# Patient Record
Sex: Female | Born: 1985 | Race: White | Hispanic: No | Marital: Single | Smoking: Never smoker
Health system: Southern US, Community
[De-identification: ages and names within clinical notes are randomized; demographics above are authoritative.]

## PROBLEM LIST (undated history)

## (undated) DIAGNOSIS — L509 Urticaria, unspecified: Secondary | ICD-10-CM

## (undated) DIAGNOSIS — F419 Anxiety disorder, unspecified: Secondary | ICD-10-CM

## (undated) DIAGNOSIS — E079 Disorder of thyroid, unspecified: Secondary | ICD-10-CM

## (undated) DIAGNOSIS — L309 Dermatitis, unspecified: Secondary | ICD-10-CM

## (undated) DIAGNOSIS — K219 Gastro-esophageal reflux disease without esophagitis: Secondary | ICD-10-CM

## (undated) DIAGNOSIS — J45909 Unspecified asthma, uncomplicated: Secondary | ICD-10-CM

## (undated) HISTORY — PX: REPLACEMENT OF URETER: SUR1216

## (undated) HISTORY — DX: Unspecified asthma, uncomplicated: J45.909

## (undated) HISTORY — DX: Dermatitis, unspecified: L30.9

## (undated) HISTORY — DX: Urticaria, unspecified: L50.9

---
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
## 2010-09-12 ENCOUNTER — Ambulatory Visit (HOSPITAL_COMMUNITY): Admission: RE | Admit: 2010-09-12 | Discharge: 2010-09-12 | Payer: Self-pay | Admitting: Physician Assistant

## 2010-09-16 ENCOUNTER — Ambulatory Visit (HOSPITAL_COMMUNITY)
Admission: RE | Admit: 2010-09-16 | Discharge: 2010-09-16 | Payer: Self-pay | Source: Home / Self Care | Admitting: Family Medicine

## 2011-01-10 ENCOUNTER — Inpatient Hospital Stay (INDEPENDENT_AMBULATORY_CARE_PROVIDER_SITE_OTHER)
Admission: RE | Admit: 2011-01-10 | Discharge: 2011-01-10 | Disposition: A | Discharge status: Home / Self Care | Payer: 59 | Source: Ambulatory Visit | Attending: Emergency Medicine | Admitting: Emergency Medicine

## 2011-01-10 DIAGNOSIS — R070 Pain in throat: Secondary | ICD-10-CM

## 2011-01-10 DIAGNOSIS — R05 Cough: Secondary | ICD-10-CM

## 2011-01-10 DIAGNOSIS — R0982 Postnasal drip: Secondary | ICD-10-CM

## 2011-01-10 LAB — POCT INFECTIOUS MONO SCREEN: Mono Screen: NEGATIVE

## 2011-01-10 LAB — POCT RAPID STREP A (OFFICE): Streptococcus, Group A Screen (Direct): NEGATIVE

## 2011-03-14 ENCOUNTER — Ambulatory Visit: Payer: 59

## 2011-03-14 ENCOUNTER — Inpatient Hospital Stay (INDEPENDENT_AMBULATORY_CARE_PROVIDER_SITE_OTHER)
Admission: RE | Admit: 2011-03-14 | Discharge: 2011-03-14 | Disposition: A | Discharge status: Home / Self Care | Payer: 59 | Source: Ambulatory Visit | Attending: Emergency Medicine | Admitting: Emergency Medicine

## 2011-03-14 DIAGNOSIS — H1045 Other chronic allergic conjunctivitis: Secondary | ICD-10-CM

## 2011-03-30 ENCOUNTER — Ambulatory Visit: Payer: 59 | Attending: Neurosurgery

## 2011-03-30 DIAGNOSIS — M25519 Pain in unspecified shoulder: Secondary | ICD-10-CM | POA: Insufficient documentation

## 2011-03-30 DIAGNOSIS — IMO0001 Reserved for inherently not codable concepts without codable children: Secondary | ICD-10-CM | POA: Insufficient documentation

## 2011-03-30 DIAGNOSIS — M542 Cervicalgia: Secondary | ICD-10-CM | POA: Insufficient documentation

## 2011-03-30 DIAGNOSIS — R293 Abnormal posture: Secondary | ICD-10-CM | POA: Insufficient documentation

## 2011-04-05 ENCOUNTER — Ambulatory Visit: Payer: 59

## 2011-04-06 ENCOUNTER — Ambulatory Visit: Payer: 59 | Admitting: Physical Therapy

## 2011-04-12 ENCOUNTER — Ambulatory Visit: Payer: 59

## 2011-04-14 ENCOUNTER — Ambulatory Visit: Payer: 59

## 2011-04-18 ENCOUNTER — Ambulatory Visit: Payer: 59

## 2011-04-20 ENCOUNTER — Ambulatory Visit: Payer: 59

## 2011-04-24 ENCOUNTER — Ambulatory Visit: Payer: 59 | Attending: Neurosurgery | Admitting: Physical Therapy

## 2011-04-24 DIAGNOSIS — IMO0001 Reserved for inherently not codable concepts without codable children: Secondary | ICD-10-CM | POA: Insufficient documentation

## 2011-04-24 DIAGNOSIS — M542 Cervicalgia: Secondary | ICD-10-CM | POA: Insufficient documentation

## 2011-04-24 DIAGNOSIS — M25519 Pain in unspecified shoulder: Secondary | ICD-10-CM | POA: Insufficient documentation

## 2011-04-24 DIAGNOSIS — R293 Abnormal posture: Secondary | ICD-10-CM | POA: Insufficient documentation

## 2011-04-26 ENCOUNTER — Ambulatory Visit: Payer: 59 | Admitting: Physical Therapy

## 2011-05-03 ENCOUNTER — Ambulatory Visit: Payer: 59

## 2011-05-05 ENCOUNTER — Ambulatory Visit: Payer: 59

## 2011-05-08 ENCOUNTER — Ambulatory Visit: Payer: 59

## 2011-05-09 ENCOUNTER — Ambulatory Visit: Payer: 59 | Admitting: Physical Therapy

## 2011-05-17 ENCOUNTER — Encounter: Payer: 59 | Admitting: Physical Therapy

## 2011-06-11 ENCOUNTER — Inpatient Hospital Stay (INDEPENDENT_AMBULATORY_CARE_PROVIDER_SITE_OTHER)
Admission: RE | Admit: 2011-06-11 | Discharge: 2011-06-11 | Disposition: A | Discharge status: Home / Self Care | Payer: 59 | Source: Ambulatory Visit | Attending: Family Medicine | Admitting: Family Medicine

## 2011-06-11 DIAGNOSIS — J020 Streptococcal pharyngitis: Secondary | ICD-10-CM

## 2011-06-11 LAB — POCT RAPID STREP A: Streptococcus, Group A Screen (Direct): NEGATIVE

## 2011-06-24 ENCOUNTER — Inpatient Hospital Stay (INDEPENDENT_AMBULATORY_CARE_PROVIDER_SITE_OTHER)
Admission: RE | Admit: 2011-06-24 | Discharge: 2011-06-24 | Disposition: A | Discharge status: Home / Self Care | Payer: 59 | Source: Ambulatory Visit | Attending: Family Medicine | Admitting: Family Medicine

## 2011-06-24 DIAGNOSIS — J039 Acute tonsillitis, unspecified: Secondary | ICD-10-CM

## 2011-06-24 DIAGNOSIS — J029 Acute pharyngitis, unspecified: Secondary | ICD-10-CM

## 2011-06-24 LAB — DIFFERENTIAL
Basophils Absolute: 0 10*3/uL (ref 0.0–0.1)
Basophils Relative: 0 % (ref 0–1)
Eosinophils Absolute: 0.1 10*3/uL (ref 0.0–0.7)
Eosinophils Relative: 0 % (ref 0–5)
Monocytes Absolute: 1.7 10*3/uL — ABNORMAL HIGH (ref 0.1–1.0)
Neutro Abs: 22 10*3/uL — ABNORMAL HIGH (ref 1.7–7.7)

## 2011-06-24 LAB — MONONUCLEOSIS SCREEN: Mono Screen: NEGATIVE

## 2011-06-24 LAB — CBC
MCHC: 34.3 g/dL (ref 30.0–36.0)
RDW: 13.2 % (ref 11.5–15.5)

## 2011-06-24 LAB — POCT RAPID STREP A: Streptococcus, Group A Screen (Direct): NEGATIVE

## 2011-06-25 ENCOUNTER — Emergency Department (HOSPITAL_COMMUNITY)
Admission: EM | Admit: 2011-06-25 | Discharge: 2011-06-25 | Disposition: A | Discharge status: Home / Self Care | Payer: 59 | Attending: Emergency Medicine | Admitting: Emergency Medicine

## 2011-06-25 ENCOUNTER — Inpatient Hospital Stay (INDEPENDENT_AMBULATORY_CARE_PROVIDER_SITE_OTHER)
Admission: RE | Admit: 2011-06-25 | Discharge: 2011-06-25 | Disposition: A | Discharge status: Home / Self Care | Payer: 59 | Source: Ambulatory Visit | Attending: Family Medicine | Admitting: Family Medicine

## 2011-06-25 DIAGNOSIS — J45909 Unspecified asthma, uncomplicated: Secondary | ICD-10-CM | POA: Insufficient documentation

## 2011-06-25 DIAGNOSIS — J029 Acute pharyngitis, unspecified: Secondary | ICD-10-CM

## 2011-06-25 DIAGNOSIS — R51 Headache: Secondary | ICD-10-CM | POA: Insufficient documentation

## 2011-06-25 DIAGNOSIS — E039 Hypothyroidism, unspecified: Secondary | ICD-10-CM | POA: Insufficient documentation

## 2011-06-25 DIAGNOSIS — Z79899 Other long term (current) drug therapy: Secondary | ICD-10-CM | POA: Insufficient documentation

## 2011-06-25 DIAGNOSIS — K219 Gastro-esophageal reflux disease without esophagitis: Secondary | ICD-10-CM | POA: Insufficient documentation

## 2011-06-25 LAB — DIFFERENTIAL
Basophils Relative: 0 % (ref 0–1)
Eosinophils Absolute: 0 10*3/uL (ref 0.0–0.7)
Eosinophils Relative: 0 % (ref 0–5)
Neutrophils Relative %: 90 % — ABNORMAL HIGH (ref 43–77)

## 2011-06-25 LAB — STREP A DNA PROBE: Group A Strep Probe: NEGATIVE

## 2011-06-25 LAB — CBC
MCH: 30.4 pg (ref 26.0–34.0)
Platelets: 363 10*3/uL (ref 150–400)
RBC: 3.81 MIL/uL — ABNORMAL LOW (ref 3.87–5.11)
RDW: 13.4 % (ref 11.5–15.5)
WBC: 23.6 10*3/uL — ABNORMAL HIGH (ref 4.0–10.5)

## 2011-11-28 ENCOUNTER — Other Ambulatory Visit: Payer: Self-pay | Admitting: Otolaryngology

## 2011-11-28 DIAGNOSIS — R599 Enlarged lymph nodes, unspecified: Secondary | ICD-10-CM

## 2011-11-28 DIAGNOSIS — J312 Chronic pharyngitis: Secondary | ICD-10-CM

## 2011-12-06 ENCOUNTER — Ambulatory Visit
Admission: RE | Admit: 2011-12-06 | Discharge: 2011-12-06 | Disposition: A | Discharge status: Home / Self Care | Payer: 59 | Source: Ambulatory Visit | Attending: Otolaryngology | Admitting: Otolaryngology

## 2011-12-06 DIAGNOSIS — R599 Enlarged lymph nodes, unspecified: Secondary | ICD-10-CM

## 2011-12-06 DIAGNOSIS — J312 Chronic pharyngitis: Secondary | ICD-10-CM

## 2012-12-19 ENCOUNTER — Other Ambulatory Visit (HOSPITAL_COMMUNITY): Payer: Self-pay | Admitting: Family Medicine

## 2012-12-19 DIAGNOSIS — R1011 Right upper quadrant pain: Secondary | ICD-10-CM

## 2012-12-20 ENCOUNTER — Ambulatory Visit (HOSPITAL_COMMUNITY)
Admission: RE | Admit: 2012-12-20 | Discharge: 2012-12-20 | Disposition: A | Discharge status: Home / Self Care | Payer: 59 | Source: Ambulatory Visit | Attending: Family Medicine | Admitting: Family Medicine

## 2012-12-20 DIAGNOSIS — R110 Nausea: Secondary | ICD-10-CM | POA: Insufficient documentation

## 2012-12-20 DIAGNOSIS — R1011 Right upper quadrant pain: Secondary | ICD-10-CM | POA: Insufficient documentation

## 2013-01-15 ENCOUNTER — Other Ambulatory Visit (HOSPITAL_COMMUNITY): Payer: Self-pay | Admitting: Physician Assistant

## 2013-01-15 DIAGNOSIS — R109 Unspecified abdominal pain: Secondary | ICD-10-CM

## 2013-01-29 ENCOUNTER — Encounter (HOSPITAL_COMMUNITY)
Admission: RE | Admit: 2013-01-29 | Discharge: 2013-01-29 | Disposition: A | Discharge status: Home / Self Care | Payer: 59 | Source: Ambulatory Visit | Attending: Physician Assistant | Admitting: Physician Assistant

## 2013-01-29 DIAGNOSIS — R109 Unspecified abdominal pain: Secondary | ICD-10-CM | POA: Insufficient documentation

## 2013-01-29 MED ORDER — TECHNETIUM TC 99M MEBROFENIN IV KIT
5.0000 | PACK | Freq: Once | INTRAVENOUS | Status: AC | PRN
  Administered 2013-01-29: 5 via INTRAVENOUS

## 2013-01-29 MED ORDER — SINCALIDE 5 MCG IJ SOLR
0.0200 ug/kg | Freq: Once | INTRAMUSCULAR | Status: DC
  Administered 2013-01-29: 1.34 ug via INTRAVENOUS

## 2017-03-08 ENCOUNTER — Ambulatory Visit (INDEPENDENT_AMBULATORY_CARE_PROVIDER_SITE_OTHER): Payer: Commercial Managed Care - PPO | Admitting: Otolaryngology

## 2017-03-08 DIAGNOSIS — J342 Deviated nasal septum: Secondary | ICD-10-CM | POA: Diagnosis not present

## 2017-03-08 DIAGNOSIS — J343 Hypertrophy of nasal turbinates: Secondary | ICD-10-CM | POA: Diagnosis not present

## 2017-03-08 DIAGNOSIS — J310 Chronic rhinitis: Secondary | ICD-10-CM

## 2017-04-19 ENCOUNTER — Ambulatory Visit (INDEPENDENT_AMBULATORY_CARE_PROVIDER_SITE_OTHER): Payer: Commercial Managed Care - PPO | Admitting: Otolaryngology

## 2017-04-19 DIAGNOSIS — J310 Chronic rhinitis: Secondary | ICD-10-CM

## 2017-04-19 DIAGNOSIS — R51 Headache: Secondary | ICD-10-CM | POA: Diagnosis not present

## 2017-04-24 ENCOUNTER — Other Ambulatory Visit (INDEPENDENT_AMBULATORY_CARE_PROVIDER_SITE_OTHER): Payer: Self-pay | Admitting: Otolaryngology

## 2017-04-24 DIAGNOSIS — J320 Chronic maxillary sinusitis: Secondary | ICD-10-CM

## 2017-04-30 ENCOUNTER — Ambulatory Visit (HOSPITAL_COMMUNITY)
Admission: RE | Admit: 2017-04-30 | Discharge: 2017-04-30 | Disposition: A | Discharge status: Home / Self Care | Payer: Commercial Managed Care - PPO | Source: Ambulatory Visit | Attending: Otolaryngology | Admitting: Otolaryngology

## 2017-04-30 DIAGNOSIS — J320 Chronic maxillary sinusitis: Secondary | ICD-10-CM

## 2017-06-25 ENCOUNTER — Ambulatory Visit (INDEPENDENT_AMBULATORY_CARE_PROVIDER_SITE_OTHER): Payer: No Typology Code available for payment source | Admitting: Otolaryngology

## 2017-06-25 DIAGNOSIS — J342 Deviated nasal septum: Secondary | ICD-10-CM | POA: Diagnosis not present

## 2017-06-25 DIAGNOSIS — J343 Hypertrophy of nasal turbinates: Secondary | ICD-10-CM | POA: Diagnosis not present

## 2017-06-25 DIAGNOSIS — J310 Chronic rhinitis: Secondary | ICD-10-CM | POA: Diagnosis not present

## 2017-11-13 ENCOUNTER — Encounter (HOSPITAL_BASED_OUTPATIENT_CLINIC_OR_DEPARTMENT_OTHER): Payer: Self-pay | Admitting: Emergency Medicine

## 2017-11-13 ENCOUNTER — Other Ambulatory Visit: Payer: Self-pay

## 2017-11-13 ENCOUNTER — Emergency Department (HOSPITAL_BASED_OUTPATIENT_CLINIC_OR_DEPARTMENT_OTHER): Payer: No Typology Code available for payment source

## 2017-11-13 ENCOUNTER — Emergency Department (HOSPITAL_BASED_OUTPATIENT_CLINIC_OR_DEPARTMENT_OTHER)
Admission: EM | Admit: 2017-11-13 | Discharge: 2017-11-13 | Disposition: A | Discharge status: Home / Self Care | Payer: No Typology Code available for payment source | Attending: Emergency Medicine | Admitting: Emergency Medicine

## 2017-11-13 DIAGNOSIS — Y939 Activity, unspecified: Secondary | ICD-10-CM | POA: Diagnosis not present

## 2017-11-13 DIAGNOSIS — Z79899 Other long term (current) drug therapy: Secondary | ICD-10-CM | POA: Diagnosis not present

## 2017-11-13 DIAGNOSIS — Y929 Unspecified place or not applicable: Secondary | ICD-10-CM | POA: Diagnosis not present

## 2017-11-13 DIAGNOSIS — S022XXA Fracture of nasal bones, initial encounter for closed fracture: Secondary | ICD-10-CM | POA: Diagnosis not present

## 2017-11-13 DIAGNOSIS — W228XXA Striking against or struck by other objects, initial encounter: Secondary | ICD-10-CM | POA: Diagnosis not present

## 2017-11-13 DIAGNOSIS — Y998 Other external cause status: Secondary | ICD-10-CM | POA: Diagnosis not present

## 2017-11-13 DIAGNOSIS — S0992XA Unspecified injury of nose, initial encounter: Secondary | ICD-10-CM | POA: Diagnosis present

## 2017-11-13 HISTORY — DX: Anxiety disorder, unspecified: F41.9

## 2017-11-13 HISTORY — DX: Disorder of thyroid, unspecified: E07.9

## 2017-11-13 HISTORY — DX: Gastro-esophageal reflux disease without esophagitis: K21.9

## 2017-11-13 MED ORDER — OXYMETAZOLINE HCL 0.05 % NA SOLN: 1.0000 | Freq: Two times a day (BID) | NASAL | 0 refills | Status: AC

## 2017-11-13 NOTE — ED Provider Notes (Signed)
[facility] EMERGENCY DEPARTMENT Provider Note   CSN: [id number] Arrival date & time: 11/13/17  1249     History   Chief Complaint Chief Complaint  Patient presents with  . Headache    HPI Jennifer [name] is a 31 y.o. female.  HPI   31 year old female presents with concern for nasal pain, frontal headache, after hitting her nose with her truck door last night.  Patient reports that she is loading things into the back of the car, and the truck fell down, hitting her directly in the nose.  It did not hit her anywhere else in the head, no loss of consciousness.  No nausea, vomiting.  Reports that she had noted some blood in her throat last night, but has not had other bleeding.  Reports chronic nasal drainage and sinus congestion.  Reports she has significant amount of pressure like pain around her nose.   Past Medical History:  Diagnosis Date  . Anxiety   . GERD (gastroesophageal reflux disease)   . Thyroid disease     There are no active problems to display for this patient.   History reviewed. No pertinent surgical history.  OB History    No data available       Home Medications    Prior to Admission medications   Medication Sig Start Date End Date Taking? Authorizing Provider  Azelastine-Fluticasone (DYMISTA NA) Place into the nose.   Yes [provider]  DiazePAM (VALIUM PO) Take by mouth.   Yes [provider]  levothyroxine (SYNTHROID, LEVOTHROID) 50 MCG tablet Take 50 mcg by mouth daily before breakfast.   Yes [provider]  omeprazole (PRILOSEC) 20 MG capsule Take 20 mg by mouth daily.   Yes [provider]  sertraline (ZOLOFT) 25 MG tablet Take 25 mg by mouth daily.   Yes [provider]  oxymetazoline (AFRIN NASAL SPRAY) 0.05 % nasal spray Place 1 spray into both nostrils 2 (two) times daily for 3 days. 11/13/17 11/16/17  Alvira MondaySchlossman, Kostas Marrow, MD    Family History History reviewed. No pertinent family  history.  Social History Social History   Tobacco Use  . Smoking status: Never Smoker  . Smokeless tobacco: Never Used  Substance Use Topics  . Alcohol use: No    Frequency: Never  . Drug use: No     Allergies   Amoxicillin; Ceftin [cefuroxime axetil]; and Penicillins   Review of Systems Review of Systems  Constitutional: Negative for fever.  HENT: Negative for sore throat.   Eyes: Negative for visual disturbance.  Respiratory: Negative for cough and shortness of breath.   Cardiovascular: Negative for chest pain.  Gastrointestinal: Negative for abdominal pain, nausea and vomiting.  Genitourinary: Negative for difficulty urinating.  Musculoskeletal: Positive for arthralgias. Negative for back pain and neck pain.  Skin: Negative for rash.  Neurological: Negative for syncope and headaches.     Physical Exam Updated Vital Signs BP (!) 160/108 (BP Location: Left Arm) Comment: pt vomited in waiting area Bathroom before triage  Pulse 96   Temp 97.9 F (36.6 C) (Oral)   Resp 20   Ht 5\' 5"  (1.651 m)   Wt 113.4 kg (250 lb)   LMP 10/29/2017   SpO2 100%   BMI 41.60 kg/m   Physical Exam  Constitutional: She is oriented to person, place, and time. She appears well-developed and well-nourished. No distress.  HENT:  Head: Normocephalic and atraumatic.  Mouth/Throat: Oropharynx is clear and moist.  pipoint abrasion left  side of nose Mild septal deviation No nasal septal hematomas Mild nasal bridge swelling   Eyes: Conjunctivae and EOM are normal. Pupils are equal, round, and reactive to light.  Neck: Normal range of motion.  Cardiovascular: Normal rate, regular rhythm and intact distal pulses.  Pulmonary/Chest: Effort normal. No respiratory distress.  Neurological: She is alert and oriented to person, place, and time.  Skin: Skin is warm and dry. No rash noted. She is not diaphoretic. No erythema.  Nursing note and vitals reviewed.    ED Treatments / Results   Labs (all labs ordered are listed, but only abnormal results are displayed) Labs Reviewed - No data to display  EKG  EKG Interpretation None       Radiology Dg Nasal Bones  Result Date: 11/13/2017 CLINICAL DATA:  Blunt trauma to the nose. Left side pain greater than right. EXAM: NASAL BONES - 3+ VIEW COMPARISON:  Sinus CT 04/30/2017 FINDINGS: Concern for a minimally displaced fracture involving the left nasal bone. Right nasal bone appears intact. Visualized paranasal sinuses are aerated. IMPRESSION: Concern for a minimally displaced left nasal bone fracture. Electronically Signed   By: Richarda OverlieAdam  Henn M.D.   On: 11/13/2017 13:48    Procedures Procedures (including critical care time)  Medications Ordered in ED Medications - No data to display   Initial Impression / Assessment and Plan / ED Course  I have reviewed the triage vital signs and the nursing notes.  Pertinent labs & imaging results that were available during my care of the patient were reviewed by me and considered in my medical decision making (see chart for details).     31yo female presents with concern for nasal and facial pain after hitting her nose last night.  She had no head trauma, low suspicion for intracranial injury.  She also denies any trauma to the face.  Suspect this is an isolated nasal bone fracture.  She has no sign of nasal septal hematoma.  She sees Dr. Jodean Limaio, recommend follow-up as needed if she desires surgical repair. Recommend ice, ibuprofen, tylenol. Patient discharged in stable condition with understanding of reasons to return.   Final Clinical Impressions(s) / ED Diagnoses   Final diagnoses:  Closed fracture of nasal bone, initial encounter    ED Discharge Orders        Ordered    oxymetazoline (AFRIN NASAL SPRAY) 0.05 % nasal spray  2 times daily     11/13/17 1431       Alvira MondaySchlossman, Domino Holten, MD 11/13/17 1612

## 2017-11-13 NOTE — ED Triage Notes (Signed)
Patient states that she had a trunk lid hit her nasal bone last night,.Since she has had pain and swelling with intermittent nose bleed and headache

## 2017-11-15 ENCOUNTER — Ambulatory Visit (INDEPENDENT_AMBULATORY_CARE_PROVIDER_SITE_OTHER): Payer: No Typology Code available for payment source | Admitting: Otolaryngology

## 2017-11-15 DIAGNOSIS — J343 Hypertrophy of nasal turbinates: Secondary | ICD-10-CM | POA: Diagnosis not present

## 2017-11-15 DIAGNOSIS — J342 Deviated nasal septum: Secondary | ICD-10-CM

## 2017-11-15 DIAGNOSIS — S022XXA Fracture of nasal bones, initial encounter for closed fracture: Secondary | ICD-10-CM

## 2018-01-24 ENCOUNTER — Encounter (HOSPITAL_COMMUNITY): Payer: Self-pay | Admitting: Emergency Medicine

## 2018-01-24 ENCOUNTER — Ambulatory Visit (INDEPENDENT_AMBULATORY_CARE_PROVIDER_SITE_OTHER): Payer: No Typology Code available for payment source

## 2018-01-24 ENCOUNTER — Ambulatory Visit (HOSPITAL_COMMUNITY)
Admission: EM | Admit: 2018-01-24 | Discharge: 2018-01-24 | Disposition: A | Discharge status: Home / Self Care | Payer: No Typology Code available for payment source | Attending: Family Medicine | Admitting: Family Medicine

## 2018-01-24 DIAGNOSIS — J4521 Mild intermittent asthma with (acute) exacerbation: Secondary | ICD-10-CM

## 2018-01-24 DIAGNOSIS — R0789 Other chest pain: Secondary | ICD-10-CM

## 2018-01-24 DIAGNOSIS — R0602 Shortness of breath: Secondary | ICD-10-CM

## 2018-01-24 MED ORDER — FLUCONAZOLE 150 MG PO TABS: 150.0000 mg | ORAL_TABLET | Freq: Once | ORAL | 0 refills | Status: AC

## 2018-01-24 MED ORDER — PREDNISONE 10 MG (21) PO TBPK: ORAL_TABLET | Freq: Every day | ORAL | 0 refills | Status: DC

## 2018-01-24 NOTE — ED Provider Notes (Addendum)
[facility]ARE [facility]   [id number] 01/24/18 Arrival Time: 1956   SUBJECTIVE:  Jennifer [name] is a 32 y.o. female who presents to the urgent care with complaint of shortness of breath.  Pt states shes been taking doxy for 10 days, and prednisone for five days for sinus issues, states shes still been feeling bad still, with some shortness of breath. Pt requesting xray.   The prednisone did help, but she finished it 4 days ago and symptoms are worsening  Patient has asthma and is using her inhaler.  Her tongue is getting sore.  Patient works in [employer].   Past Medical History:  Diagnosis Date  . Anxiety   . GERD (gastroesophageal reflux disease)   . Thyroid disease    No family history on file. Social History   Socioeconomic History  . Marital status: Single    Spouse name: Not on file  . Number of children: Not on file  . Years of education: Not on file  . Highest education level: Not on file  Social Needs  . Financial resource strain: Not on file  . Food insecurity - worry: Not on file  . Food insecurity - inability: Not on file  . Transportation needs - medical: Not on file  . Transportation needs - non-medical: Not on file  Occupational History  . Not on file  Tobacco Use  . Smoking status: Never Smoker  . Smokeless tobacco: Never Used  Substance and Sexual Activity  . Alcohol use: No    Frequency: Never  . Drug use: No  . Sexual activity: Not on file  Other Topics Concern  . Not on file  Social History Narrative  . Not on file   Current Meds  Medication Sig  . [DISCONTINUED] doxycycline (VIBRAMYCIN) 100 MG capsule Take 100 mg by mouth 2 (two) times daily.   Allergies  Allergen Reactions  . Amoxicillin   . Ceftin [Cefuroxime Axetil]   . Penicillins       ROS: As per HPI, remainder of ROS negative.   OBJECTIVE:   Vitals:   01/24/18 2002  BP: (!) 154/92  Pulse: 93  Resp: 18  Temp: 98.1 F (36.7 C)  SpO2: 100%     General appearance: alert;  no distress Eyes: PERRL; EOMI; conjunctiva normal HENT: normocephalic; atraumatic; TMs normal, canal normal, external ears normal without trauma; nasal mucosa normal; oral mucosa coated tongue Neck: supple Lungs: clear to auscultation bilaterally Heart: regular rate and rhythm Back: no CVA tenderness Extremities: no cyanosis or edema; symmetrical with no gross deformities Skin: warm and dry Neurologic: normal gait; grossly normal Psychological: alert and cooperative; normal mood and affect      Labs:  Results for orders placed or performed during the hospital encounter of 06/25/11  Differential  Result Value Ref Range   Neutrophils Relative % 90 (H) 43 - 77 %   Neutro Abs 21.2 (H) 1.7 - 7.7 K/uL   Lymphocytes Relative 5 (L) 12 - 46 %   Lymphs Abs 1.3 0.7 - 4.0 K/uL   Monocytes Relative 5 3 - 12 %   Monocytes Absolute 1.1 (H) 0.1 - 1.0 K/uL   Eosinophils Relative 0 0 - 5 %   Eosinophils Absolute 0.0 0.0 - 0.7 K/uL   Basophils Relative 0 0 - 1 %   Basophils Absolute 0.0 0.0 - 0.1 K/uL  CBC  Result Value Ref Range   WBC 23.6 (H) 4.0 - 10.5 K/uL   RBC 3.81 (L) 3.87 -  5.11 MIL/uL   Hemoglobin 11.6 (L) 12.0 - 15.0 g/dL   HCT 16.134.5 (L) 09.636.0 - 04.546.0 %   MCV 90.6 78.0 - 100.0 fL   MCH 30.4 26.0 - 34.0 pg   MCHC 33.6 30.0 - 36.0 g/dL   RDW 40.913.4 81.111.5 - 91.415.5 %   Platelets 363 150 - 400 K/uL    Labs Reviewed - No data to display  Dg Chest 2 View  Result Date: 01/24/2018 CLINICAL DATA:  Per pt: has been on antibiotics for a week for a sinus infection, having chest pain and tightness, no fever, feeling hot, chest pain mid sternum, fatigue sleeping a lot. History of pneumonia age 32. Non smoker. EXAM: CHEST - 2 VIEW COMPARISON:  11/15/2011 FINDINGS: The heart size and mediastinal contours are within normal limits. Both lungs are clear. The visualized skeletal structures are unremarkable. IMPRESSION: No active cardiopulmonary disease. Electronically Signed   By: Norva PavlovElizabeth  Brown M.D.    On: 01/24/2018 20:18       ASSESSMENT & PLAN:  1. Mild intermittent asthma with exacerbation     Meds ordered this encounter  Medications  . fluconazole (DIFLUCAN) 150 MG tablet    Sig: Take 1 tablet (150 mg total) by mouth once for 1 dose. Repeat if needed    Dispense:  2 tablet    Refill:  0  . predniSONE (STERAPRED UNI-PAK 21 TAB) 10 MG (21) TBPK tablet    Sig: Take by mouth daily. Take 6 tabs by mouth daily  for 2 days, then 5 tabs for 2 days, then 4 tabs for 2 days, then 3 tabs for 2 days, 2 tabs for 2 days, then 1 tab by mouth daily for 2 days    Dispense:  42 tablet    Refill:  0    Reviewed expectations re: course of current medical issues. Questions answered. Outlined signs and symptoms indicating need for more acute intervention. Patient verbalized understanding. After Visit Summary given.    Procedures:      Elvina SidleLauenstein, Jontez Redfield, MD 01/24/18 2028    Elvina SidleLauenstein, Viveka Wilmeth, MD 01/24/18 2032

## 2018-01-24 NOTE — Discharge Instructions (Signed)
Discontinue the doxycycline

## 2018-01-24 NOTE — ED Triage Notes (Signed)
Pt states shes been taking doxy for 10 days, and prednisone for five days for sinus issues, states shes still been feeling bad still, with some shortness of breath. Pt requesting xray.

## 2018-06-28 ENCOUNTER — Ambulatory Visit (HOSPITAL_COMMUNITY)
Admission: EM | Admit: 2018-06-28 | Discharge: 2018-06-28 | Disposition: A | Discharge status: Home / Self Care | Payer: No Typology Code available for payment source | Attending: Family Medicine | Admitting: Family Medicine

## 2018-06-28 ENCOUNTER — Encounter (HOSPITAL_COMMUNITY): Payer: Self-pay | Admitting: Emergency Medicine

## 2018-06-28 DIAGNOSIS — L509 Urticaria, unspecified: Secondary | ICD-10-CM | POA: Diagnosis not present

## 2018-06-28 MED ORDER — ACYCLOVIR 400 MG PO TABS: 400.0000 mg | ORAL_TABLET | Freq: Two times a day (BID) | ORAL | 11 refills | Status: DC

## 2018-06-28 NOTE — ED Triage Notes (Signed)
Pt sts allergic reaction with hives x several weeks worse over last two days; pt sts went to PCP yesterday and given steroid injection; pt sts after eating lunch today had some flushed feeling and tightness in chest

## 2018-06-28 NOTE — Discharge Instructions (Addendum)
Several suggestions:  Zyrtec (Cetirizine) 10 mg twice daily  (H2 antihistamine) Zantac (Ranitidine) 150 mg twice dail (H1 antihistamine)  Acyclovir to treat for cold sores, a common trigger for hives, twice a day for 6 months.  You may need to see an allergist

## 2018-06-28 NOTE — ED Provider Notes (Signed)
[facility] CARE CENTER   [id number] 06/28/18 Arrival Time: 1651   SUBJECTIVE:  Jennifer [name] is a 32 y.o. female who presents to the urgent care with complaint of allergic reaction with hives x several weeks worse over last two days; pt sts went to PCP yesterday and given steroid injection; pt sts after eating lunch today had some flushed feeling and tightness in chest  Patient has had progressive total body itching x 3 months.  She has a h/o HSV I with multiple recurrences.      Past Medical History:  Diagnosis Date  . Anxiety   . GERD (gastroesophageal reflux disease)   . Thyroid disease    History reviewed. No pertinent family history. Social History   Socioeconomic History  . Marital status: Single    Spouse name: Not on file  . Number of children: Not on file  . Years of education: Not on file  . Highest education level: Not on file  Occupational History  . Not on file  Social Needs  . Financial resource strain: Not on file  . Food insecurity:    Worry: Not on file    Inability: Not on file  . Transportation needs:    Medical: Not on file    Non-medical: Not on file  Tobacco Use  . Smoking status: Never Smoker  . Smokeless tobacco: Never Used  Substance and Sexual Activity  . Alcohol use: No    Frequency: Never  . Drug use: No  . Sexual activity: Not on file  Lifestyle  . Physical activity:    Days per week: Not on file    Minutes per session: Not on file  . Stress: Not on file  Relationships  . Social connections:    Talks on phone: Not on file    Gets together: Not on file    Attends religious service: Not on file    Active member of club or organization: Not on file    Attends meetings of clubs or organizations: Not on file    Relationship status: Not on file  . Intimate partner violence:    Fear of current or ex partner: Not on file    Emotionally abused: Not on file    Physically abused: Not on file    Forced sexual activity: Not on file    Other Topics Concern  . Not on file  Social History Narrative  . Not on file   No outpatient medications have been marked as taking for the 06/28/18 encounter [facility] Encounter).   Allergies  Allergen Reactions  . Amoxicillin   . Ceftin [Cefuroxime Axetil]   . Penicillins       ROS: As per HPI, remainder of ROS negative.   OBJECTIVE:   Vitals:   06/28/18 1659  BP: 137/74  Pulse: 82  Resp: 18  Temp: 98.1 F (36.7 C)  TempSrc: Oral  SpO2: 100%     General appearance: alert; no distress Eyes: PERRL; EOMI; conjunctiva normal HENT: normocephalic; atraumatic;  external ears normal without trauma; nasal mucosa normal; oral mucosa normal Neck: supple Lungs: clear to auscultation bilaterally Heart: regular rate and rhythm Extremities: no cyanosis or edema; symmetrical with no gross deformities Skin: warm and dry Neurologic: normal gait; grossly normal Psychological: alert and cooperative; normal mood and affect      Labs:  Results for orders placed or performed during the hospital encounter of 06/25/11  Differential  Result Value Ref Range   Neutrophils Relative % 90 (H)  43 - 77 %   Neutro Abs 21.2 (H) 1.7 - 7.7 K/uL   Lymphocytes Relative 5 (L) 12 - 46 %   Lymphs Abs 1.3 0.7 - 4.0 K/uL   Monocytes Relative 5 3 - 12 %   Monocytes Absolute 1.1 (H) 0.1 - 1.0 K/uL   Eosinophils Relative 0 0 - 5 %   Eosinophils Absolute 0.0 0.0 - 0.7 K/uL   Basophils Relative 0 0 - 1 %   Basophils Absolute 0.0 0.0 - 0.1 K/uL  CBC  Result Value Ref Range   WBC 23.6 (H) 4.0 - 10.5 K/uL   RBC 3.81 (L) 3.87 - 5.11 MIL/uL   Hemoglobin 11.6 (L) 12.0 - 15.0 g/dL   HCT 16.134.5 (L) 09.636.0 - 04.546.0 %   MCV 90.6 78.0 - 100.0 fL   MCH 30.4 26.0 - 34.0 pg   MCHC 33.6 30.0 - 36.0 g/dL   RDW 40.913.4 81.111.5 - 91.415.5 %   Platelets 363 150 - 400 K/uL    Labs Reviewed - No data to display  No results found.     ASSESSMENT & PLAN:  1. Urticaria   Several suggestions:  Zyrtec (Cetirizine) 10  mg twice daily  (H2 antihistamine) Zantac (Ranitidine) 150 mg twice dail (H1 antihistamine)  Acyclovir to treat for cold sores, a common trigger for hives, twice a day for 6 months.  You may need to see an allergist  Meds ordered this encounter  Medications  . acyclovir (ZOVIRAX) 400 MG tablet    Sig: Take 1 tablet (400 mg total) by mouth 2 (two) times daily.    Dispense:  60 tablet    Refill:  11    Reviewed expectations re: course of current medical issues. Questions answered. Outlined signs and symptoms indicating need for more acute intervention. Patient verbalized understanding. After Visit Summary given.    Procedures:      Elvina SidleLauenstein, Aubry Tucholski, MD 06/28/18 1759

## 2018-08-07 ENCOUNTER — Ambulatory Visit: Payer: PRIVATE HEALTH INSURANCE | Admitting: Allergy & Immunology

## 2018-08-07 ENCOUNTER — Encounter: Payer: Self-pay | Admitting: Allergy & Immunology

## 2018-08-07 VITALS — BP 128/88 | HR 97 | Temp 98.5°F | Resp 14 | Ht 61.5 in | Wt 207.8 lb

## 2018-08-07 DIAGNOSIS — J4520 Mild intermittent asthma, uncomplicated: Secondary | ICD-10-CM

## 2018-08-07 DIAGNOSIS — J302 Other seasonal allergic rhinitis: Secondary | ICD-10-CM

## 2018-08-07 DIAGNOSIS — J3089 Other allergic rhinitis: Secondary | ICD-10-CM | POA: Diagnosis not present

## 2018-08-07 DIAGNOSIS — L500 Allergic urticaria: Secondary | ICD-10-CM

## 2018-08-07 LAB — PULMONARY FUNCTION TEST

## 2018-08-07 MED ORDER — AZELASTINE HCL 0.1 % NA SOLN: 2.0000 | Freq: Two times a day (BID) | NASAL | 5 refills | Status: DC

## 2018-08-07 MED ORDER — FLUTICASONE PROPIONATE 50 MCG/ACT NA SUSP: 2.0000 | Freq: Every day | NASAL | 5 refills | Status: DC | PRN

## 2018-08-07 NOTE — Progress Notes (Signed)
NEW PATIENT  Date of Service/Encounter:  08/07/18  Referring provider: Selinda Flavin, MD   Assessment:   Allergic urticaria  Perennial and seasonal allergic rhinitis (grasses, weeds, trees, molds, dust mites, cat, dog, horse, mouse)  Mild intermittent asthma, uncomplicated   Asthma Reportables:  Severity: intermittent  Risk: low Control: well controlled  Seasonal Influenza Vaccine: no but encouraged     Plan/Recommendations:   Allergic urticaria Continue ranitidine 150 mg twice a day Continue cetrizine 10 mg twice a day Stop acyclovir for now  Allergic rhinitis Allergen avoidance measures provided Flonase 2 sprays in each nostril once a day as needed for a stuffy nose Astelin nasal spray 2 sprays in each nostril twice a day as needed for a runny nose or sneezing Consider saline nasal rinses. Use this before medicated nasal sprays - Consider allergy shots as a means of long-term control. - Allergy shots "re-train" and "reset" the immune system to ignore environmental allergens and decrease the resulting immune response to those allergens (sneezing, itchy watery eyes, runny nose, nasal congestion, etc).    - Allergy shots improve symptoms in 75-85% of patients. Call the office if you want to move forward with allergy shots.   Mild intermittent asthma  Continue Proventil 2 puffs every 4 hours as needed for cough or wheeze. Use Proventil 5-15 minutes before exercise to prevent cough or wheeze  Follow up in 4 weeks or sooner if needed.    Subjective:   Jennifer [name] is a 32 y.o. female presenting today for evaluation of  Chief Complaint  Patient presents with  . Urticaria    Jennifer [name] has a history of the following: There are no active problems to display for this patient.   History obtained from: chart review and patient interview.  Kathrine Cords was referred by Selinda Flavin, MD.     Jennifer [name] is a 32 y.o. female presenting for hives that occurred  about 1 1/2 months ago for which she visited the emergency department on 06/28/2018 and was prescribed  Cetirizine 10 mg twice a day, ranitidine 150 mg twice a day, and acyclovir 400 mg twice a day. She reports that this combination of medications has decreased the frequency and severity of hives. Over the past 1 1/2 months, Simone has experienced recurrent episodes of hives. The hives have appeared at different times over her entire body.  The lesions are described as erythematous, raised, and pruritic.  Individual hives last less than 24 hours without leaving residual pigmentation or bruising. She denies concomitant angioedema, cardiopulmonary symptoms and GI symptoms. She has not experienced unexpected weight loss, recurrent fevers or drenching night sweats. No specific medication, food, skin care product, detergent, soap, or other environmental triggers have been identified. The symptoms do not seem to correlate with NSAIDs use or emotional stress. She did not have symptoms consistent with a respiratory tract infection at the time of symptom onset. [name] has tried to control symptoms with OTC antihistamines which have offered minimal temporary relief. She has been evaluated and treated in the emergency department for these symptoms. Skin biopsy has not been performed. Recently, she has been experiencing hives daily. Reporting no fever, myalgias, or arthralgias.   Asthma/Respiratory Symptom History: [name] reports having asthma as a child for which she thinks she used Advair which she last used at about age 68. She reports recently she has experienced occasional shortness of breath and has had a cough with clear mucus for the last few days. She  reports that she uses albuterol via inhaler about once every 2-3 months with relief of symptoms.   Allergic Rhinitis Symptom History: She reports nasal congestion, runny nose, and sneezing that are year round with a seasonal component. She takes Claritin D for relief of  these symptoms and uses Zaditor eye drops as needed.   Food Allergy Symptom History: She reports that she eats a varied diet that is frequently at a restaurant and is not aware of any foods that may be causing hives. She reports that, prior to a month and a half ago when she started taking ranitidine, she had frequent heartburn.   Otherwise, there is no history of other atopic diseases, including asthma, food allergies, environmental allergies, eczema or urticaria. There is no significant infectious history. Vaccinations are up to date.   Past Medical History: There are no active problems to display for this patient.   Medication List:  Allergies as of 08/07/2018      Reactions   Amoxicillin    Ceftin [cefuroxime Axetil]    Penicillins       Medication List        Accurate as of 08/07/18 11:59 PM. Always use your most recent med list.          acyclovir 400 MG tablet Commonly known as:  ZOVIRAX Take 1 tablet (400 mg total) by mouth 2 (two) times daily.   azelastine 0.1 % nasal spray Commonly known as:  ASTELIN Place 2 sprays into both nostrils 2 (two) times daily.   cetirizine 10 MG tablet Commonly known as:  ZYRTEC Take 10 mg by mouth 2 (two) times daily.   DYMISTA NA Place into the nose.   fluticasone 50 MCG/ACT nasal spray Commonly known as:  FLONASE Place 2 sprays into both nostrils daily as needed for allergies or rhinitis.   GOODY HEADACHE PO Take by mouth.   IBUPROFEN PO Take by mouth.   ketotifen 0.025 % ophthalmic solution Commonly known as:  ZADITOR Place 1 drop into both eyes daily as needed.   levothyroxine 88 MCG tablet Commonly known as:  SYNTHROID, LEVOTHROID 1 tablet from Monday to Saturday. Take only 2 tablet every Sunday.   LO LOESTRIN FE 1 MG-10 MCG / 10 MCG tablet Generic drug:  Norethindrone-Ethinyl Estradiol-Fe Biphas TK 1 T PO QD   prednisoLONE acetate 1 % ophthalmic suspension Commonly known as:  PRED FORTE INSTILL 1 DROP INTO  BOTH EYES QID AS DIRECTED   PROBIOTIC DAILY PO Take by mouth.   ranitidine 150 MG tablet Commonly known as:  ZANTAC Take 150 mg by mouth 2 (two) times daily.   VALIUM PO Take by mouth.   VENTOLIN HFA 108 (90 Base) MCG/ACT inhaler Generic drug:  albuterol INHALE 2 PUFFS QID PRN       Past Surgical History: Past Surgical History:  Procedure Laterality Date  . REPLACEMENT OF URETER     Ureters reimplanted in bladder  . WISDOM TOOTH EXTRACTION  2004     Family History: Family History  Problem Relation Age of Onset  . Asthma Mother   . Urticaria Mother   . Allergic rhinitis Mother   . Asthma Brother   . Eczema Brother   . Allergic rhinitis Brother      Social History: Dewayne Hatchnn lives in a 1050+ year old apartment with carpeting throughout. Heating is electric and cooling is central. There is a cat in the home who has access to her bedroom. There is no concern for water damage  or mildew in the home. There are no dust mite free covers in use on the mattress or pillows. There is no concern for exposure to fumes, chemicals, or dust in the home or workplace. 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    Review of Systems: a 14-point review of systems is pertinent for what is mentioned in HPI.  Otherwise, all other systems were negative. Constitutional: negative other than that listed in the HPI Eyes: negative other than that listed in the HPI Ears, nose, mouth, throat, and face: negative other than that listed in the HPI Respiratory: negative other than that listed in the HPI Cardiovascular: negative other than that listed in the HPI Gastrointestinal: negative other than that listed in the HPI Genitourinary: negative other than that listed in the HPI Integument: negative other than that listed in the HPI Hematologic: negative other than that listed in the HPI Musculoskeletal: negative other than that listed in the HPI Neurological: negative other than that listed in the  HPI Allergy/Immunologic: negative other than that listed in the HPI    Objective:   Blood pressure 128/88, pulse 97, temperature 98.5 F (36.9 C), temperature source Oral, resp. rate 14, height 5' 1.5" (1.562 m), weight 207 lb 12.8 oz (94.3 kg), SpO2 97 %. Body mass index is 38.63 kg/m.   Physical Exam:  General: Alert, interactive, in no acute distress. Eyes: No conjunctival injection present on the right, No conjunctival injection present on the left, no discharge on the left, no Horner-Trantas dots present and allergic shiners present bilaterally. PERRL bilaterally. EOMI without pain. No photophobia.  Ears: Right TM pearly gray with normal light reflex, Left TM pearly gray with normal light reflex, Left TM unable to be visualized due to cerumen impaction, Patent tympanostomy tube present on the right and Patent tympanostomy tube present on the left.  Nose/Throat: External nose within normal limits and septum midline. Turbinates minimally edematous without discharge. Posterior oropharynx unremarkable without cobblestoning in the posterior oropharynx. Tonsils unremarklable without exudates.  Tongue without thrush. Neck: Supple without thyromegaly. Trachea midline. Adenopathy: no enlarged lymph nodes appreciated in the occipital, axillary, epitrochlear, inguinal, or popliteal regions. Lungs: Clear to auscultation without wheezing, rhonchi or rales. No increased work of breathing. CV: Normal S1/S2. No murmurs. Capillary refill <2 seconds.  Abdomen: Nondistended, nontender. No guarding or rebound tenderness. Bowel sounds present in all fields  Skin: Warm and dry, without lesions or rashes. Extremities:  No clubbing, cyanosis or edema. Neuro:   Grossly intact. No focal deficits appreciated. Responsive to questions.  Spirometry: results normal (FEV1: 3.19%, FVC: 2.93%, FEV1/FVC: 0.92%).    Spirometry consistent with normal pattern.  Allergy Studies:   Indoor/Outdoor Percutaneous Adult  Environmental Panel: positive to bahia grass, [place] grass, johnson grass, [place] blue grass, meadow fescue grass, perennial rye grass, sweet vernal grass, timothy grass, lamb's quarters, rough marsh elder, American beech, Box elder, [place], elm, hickory, maple, oak, [place] sycamore, Alternaria, Cladosporium, Aspergillus, Bipolaris, Drechslera, epicoccum, Candida, Tricophyton, Df mite, Dp mites, cat, dog, horse, mouse and tobacco. Otherwise negative with adequate controls.  Indoor/Outdoor Selected Intradermal Environmental Panel: positive to ragweed mix and mold mix #4. Otherwise negative with adequate controls.  Most Common Foods Panel (peanut, cashew, soy, fish mix, shellfish mix, wheat, milk, egg): Negative with adequate controls.  Allergy testing results were read and interpreted by myself, documented by clinical staff.    Thank you for the opportunity to care for this patient.  Please do not  hesitate to contact me with questions.  Thermon Leyland, FNP [facility]   Malachi Bonds, MD [facility]

## 2018-08-07 NOTE — Patient Instructions (Addendum)
Allergic urticaria Continue ranitidine 150 mg twice a day Continue cetrizine 10 mg twice a day Stop acyclovir for now  Allergic rhinitis/allergic conjunctivitis Flonase 2 sprays in each nostril once a day as needed for a stuffy nose Astelin nasal spray 2 sprays in each nostril twice a day as needed for a runny nose or sneezing Consider saline nasal rinses. Use this before medicated nasal sprays Continue Zaditor eye drops as needed for red or itchy eyes  Mild intermittent asthma  Continue Proventil 2 puffs every 4 hours as needed for cough or wheeze  Follow up in 4 weeks or sooner if needed  Please inform us of any Emergency Department visits, hospitalizations, or changes in symptoms. Call us before going to the ED for breathing or allergy symptoms since we might be able to fit you in for a sick visit. Feel free to contact us anytime with any questions, problems, or concerns.  It was a pleasure to meet you today!  Websites that have reliable patient information: 1. American Academy of Asthma, Allergy, and Immunology: www.aaaai.org 2. Food Allergy Research and Education (FARE): foodallergy.org 3. Mothers of Asthmatics: http://www.asthmacommunitynetwork.org 4. American College of Allergy, Asthma, and Immunology: MissingWeapons.cawww.acaai.org   Make sure you are registered to vote! If you have moved or changed any of your contact information, you will need to get this updated before voting!  Allergy Shots   Allergies are the result of a chain reaction that starts in the immune system. Your immune system controls how your body defends itself. For instance, if you have an allergy to pollen, your immune system identifies pollen as an invader or allergen. Your immune system overreacts by producing antibodies called Immunoglobulin E (IgE). These antibodies travel to cells that release chemicals, causing an allergic reaction.  The concept behind allergy immunotherapy, whether it is received in the form of  shots or tablets, is that the immune system can be desensitized to specific allergens that trigger allergy symptoms. Although it requires time and patience, the payback can be long-term relief.  How Do Allergy Shots Work?  Allergy shots work much like a vaccine. Your body responds to injected amounts of a particular allergen given in increasing doses, eventually developing a resistance and tolerance to it. Allergy shots can lead to decreased, minimal or no allergy symptoms.  There generally are two phases: build-up and maintenance. Build-up often ranges from three to six months and involves receiving injections with increasing amounts of the allergens. The shots are typically given once or twice a week, though more rapid build-up schedules are sometimes used.  The maintenance phase begins when the most effective dose is reached. This dose is different for each person, depending on how allergic you are and your response to the build-up injections. Once the maintenance dose is reached, there are longer periods between injections, typically two to four weeks.  Occasionally doctors give cortisone-type shots that can temporarily reduce allergy symptoms. These types of shots are different and should not be confused with allergy immunotherapy shots.  Who Can Be Treated with Allergy Shots?  Allergy shots may be a good treatment approach for people with allergic rhinitis (hay fever), allergic asthma, conjunctivitis (eye allergy) or stinging insect allergy.   Before deciding to begin allergy shots, you should consider:  . The length of allergy season and the severity of your symptoms . Whether medications and/or changes to your environment can control your symptoms . Your desire to avoid long-term medication use . Time: allergy immunotherapy requires a  major time commitment . Cost: may vary depending on your insurance coverage  Allergy shots for children age 8 and older are effective and often well  tolerated. They might prevent the onset of new allergen sensitivities or the progression to asthma.  Allergy shots are not started on patients who are pregnant but can be continued on patients who become pregnant while receiving them. In some patients with other medical conditions or who take certain common medications, allergy shots may be of risk. It is important to mention other medications you talk to your allergist.   When Will I Feel Better?  Some may experience decreased allergy symptoms during the build-up phase. For others, it may take as long as 12 months on the maintenance dose. If there is no improvement after a year of maintenance, your allergist will discuss other treatment options with you.  If you aren't responding to allergy shots, it may be because there is not enough dose of the allergen in your vaccine or there are missing allergens that were not identified during your allergy testing. Other reasons could be that there are high levels of the allergen in your environment or major exposure to non-allergic triggers like tobacco smoke.  What Is the Length of Treatment?  Once the maintenance dose is reached, allergy shots are generally continued for three to five years. The decision to stop should be discussed with your allergist at that time. Some people may experience a permanent reduction of allergy symptoms. Others may relapse and a longer course of allergy shots can be considered.  What Are the Possible Reactions?  The two types of adverse reactions that can occur with allergy shots are local and systemic. Common local reactions include very mild redness and swelling at the injection site, which can happen immediately or several hours after. A systemic reaction, which is less common, affects the entire body or a particular body system. They are usually mild and typically respond quickly to medications. Signs include increased allergy symptoms such as sneezing, a stuffy nose or  hives.  Rarely, a serious systemic reaction called anaphylaxis can develop. Symptoms include swelling in the throat, wheezing, a feeling of tightness in the chest, nausea or dizziness. Most serious systemic reactions develop within 30 minutes of allergy shots. This is why it is strongly recommended you wait in your doctor's office for 30 minutes after your injections. Your allergist is trained to watch for reactions, and his or her staff is trained and equipped with the proper medications to identify and treat them.  Who Should Administer Allergy Shots?  The preferred location for receiving shots is your prescribing allergist's office. Injections can sometimes be given at another facility where the physician and staff are trained to recognize and treat reactions, and have received instructions by your prescribing allergist.

## 2018-08-08 ENCOUNTER — Encounter: Payer: Self-pay | Admitting: Allergy & Immunology

## 2018-08-12 ENCOUNTER — Other Ambulatory Visit: Payer: Self-pay | Admitting: Family Medicine

## 2018-09-04 ENCOUNTER — Encounter: Payer: Self-pay | Admitting: Allergy & Immunology

## 2018-09-04 ENCOUNTER — Ambulatory Visit: Payer: PRIVATE HEALTH INSURANCE | Admitting: Allergy & Immunology

## 2018-09-04 VITALS — BP 124/86 | HR 102 | Resp 16

## 2018-09-04 DIAGNOSIS — J302 Other seasonal allergic rhinitis: Secondary | ICD-10-CM | POA: Diagnosis not present

## 2018-09-04 DIAGNOSIS — J3089 Other allergic rhinitis: Secondary | ICD-10-CM

## 2018-09-04 DIAGNOSIS — L501 Idiopathic urticaria: Secondary | ICD-10-CM | POA: Diagnosis not present

## 2018-09-04 DIAGNOSIS — J4520 Mild intermittent asthma, uncomplicated: Secondary | ICD-10-CM | POA: Diagnosis not present

## 2018-09-04 MED ORDER — MONTELUKAST SODIUM 10 MG PO TABS: 10.0000 mg | ORAL_TABLET | Freq: Every day | ORAL | 5 refills | Status: DC

## 2018-09-04 NOTE — Progress Notes (Signed)
FOLLOW UP  Date of Service/Encounter:  09/04/18   Assessment:   Mild intermittent asthma without complication  Chronic idiopathic urticaria - wishing to initiate Xolair  Seasonal and perennial allergic rhinitis (grasses, weeds, trees, indoor and outdoor molds, dust mite, dog, cat, horse, mouse, and tobacco) - wishing to initiate allergen immunotherapy    Plan/Recommendations:   1. Allergic urticaria - Stop the ranitidine. - We will increase to cetirizine to to two at night and continue with one in the morning. - The addition of the Singulair could help as well. - We are going to obtain labs to rule out serious causes of hives since they have continued since the last visit.  - We will initiate Xolair 300 mg monthly.  2. Allergic rhinitis/allergic conjunctivitis - Continue with Dymista 1-2 sprays per nostril up to twice daily. - Continue with antihistamines as above. - The addition of the Singulair will provide some coverage as well.  - Continue with Zatidor eye drops as needed.  - We will write the prescription for the allergy shots and she can get her first injection in 2 weeks.  3. Mild intermittent asthma  - Lung testing looks good today. - We will add back on the Singulair 10mg  daily. - Continue Proventil 2 puffs every 4 hours as needed for cough or wheeze  4. Return in about 3 months (around 12/05/2018).  Subjective:   Jennifer [name] is a 32 y.o. female presenting today for follow up of  Chief Complaint  Patient presents with  . Follow-up    Jennifer [name] has a history of the following: There are no active problems to display for this patient.   History obtained from: chart review and patient.  Jennifer [name] Primary Care Provider is Selinda Flavin, MD.     Yides is a 32 y.o. female presenting for a follow up visit. Testing at the last visit showed positives to grasses, weeds, trees, molds, cat, dog, dust mite, horse, mouse, and tobacco. We started her on  Flonase as well as Astelin. We also encouraged the use of nasal saline rinses. For her hives, we started ranitidine 150mg  BID as well as cetirizine 10mg  daily. She did discuss allergy shots. Asthma was controlled with the PRN use of albuterol.   Since the last visit, she has done very well.  She is very happy with how well her allergies are controlled, but even with all the medications, she continues to have breakthrough symptoms.  She has been on Singulair in the past, mostly for her asthma.  She is interested in trying this again.  She is interested in learning more about allergy shots today.  She did call her insurance and they are covered at 100%.  Her urticaria do continue to be a problem.  She is on cetirizine 10 mg twice daily.  She was on ranitidine twice daily as well, but stopped with the recent news about the ranitidine containing carcinogenic chemicals.  With cessation of the ranitidine, her hives have popped out once again.  There is certainly not as bad as they were in the past and they leave no residual skin changes.  Even without urticaria, however, she does have itching.  She is interested in ideas to more effectively treat this itching.  Her asthma is controlled with a rescue inhaler as needed.  Her ACT score today is 20, indicating excellent asthma control.  She has hypothyroidism and is seeing Dr. Allena Katz in [facility]. She is on Synthroid  but her TFTs have been normal.   Otherwise, there have been no changes to her past medical history, surgical history, family history, or social history.    Review of Systems: a 14-point review of systems is pertinent for what is mentioned in HPI.  Otherwise, all other systems were negative.  Constitutional: negative other than that listed in the HPI Eyes: negative other than that listed in the HPI Ears, nose, mouth, throat, and face: negative other than that listed in the HPI Respiratory: negative other than that listed in the  HPI Cardiovascular: negative other than that listed in the HPI Gastrointestinal: negative other than that listed in the HPI Genitourinary: negative other than that listed in the HPI Integument: negative other than that listed in the HPI Hematologic: negative other than that listed in the HPI Musculoskeletal: negative other than that listed in the HPI Neurological: negative other than that listed in the HPI Allergy/Immunologic: negative other than that listed in the HPI    Objective:   Blood pressure 124/86, pulse (!) 102, resp. rate 16, SpO2 95 %. There is no height or weight on file to calculate BMI.   Physical Exam:  General: Alert, interactive, in no acute distress. Pleasant and talkative female.  Eyes: No conjunctival injection bilaterally, no discharge on the right, no discharge on the left, no Horner-Trantas dots present and allergic shiners present bilaterally. PERRL bilaterally. EOMI without pain. No photophobia.  Ears: Right TM pearly gray with normal light reflex, Left TM pearly gray with normal light reflex, Right TM intact without perforation and Left TM intact without perforation.  Nose/Throat: External nose within normal limits and septum midline. Turbinates edematous and pale with clear discharge. Posterior oropharynx erythematous with cobblestoning in the posterior oropharynx. Tonsils 2+ without exudates.  Tongue without thrush. Lungs: Clear to auscultation without wheezing, rhonchi or rales. No increased work of breathing. CV: Normal S1/S2. No murmurs. Capillary refill <2 seconds.  Skin: Warm and dry, without lesions or rashes. Neuro:   Grossly intact. No focal deficits appreciated. Responsive to questions.  Diagnostic studies:   Spirometry: results normal (FEV1: 2.76/101%, FVC: 3.01/87%, FEV1/FVC: 91%).    Spirometry consistent with normal pattern.  Allergy Studies: none        Allergy testing results were read and interpreted by myself, documented by  clinical staff.      Malachi Bonds, MD  [facility]

## 2018-09-04 NOTE — Patient Instructions (Addendum)
1. Allergic urticaria - Stop the ranitidine. - We will increase to cetirizine to to two at night and continue with one in the morning. - The addition of the Singulair could help as well.  2. Allergic rhinitis/allergic conjunctivitis - Continue with Dymista 1-2 sprays per nostril up to twice daily. - Continue with antihistamines as above. - The addition of the Singulair will provide some coverage as well.  - Continue with Zatidor eye drops as needed.  - Consider allergy shots for long term control.  3. Mild intermittent asthma  - Lung testing looks good today. - We will add back on the Singulair 10mg  daily. - Continue Proventil 2 puffs every 4 hours as needed for cough or wheeze  4. Return in about 3 months (around 12/05/2018).  Please inform us of any Emergency Department visits, hospitalizations, or changes in symptoms. Call us before going to the ED for breathing or allergy symptoms since we might be able to fit you in for a sick visit. Feel free to contact us anytime with any questions, problems, or concerns.  It was a pleasure to see you again today!  Websites that have reliable patient information: 1. American Academy of Asthma, Allergy, and Immunology: www.aaaai.org 2. Food Allergy Research and Education (FARE): foodallergy.org 3. Mothers of Asthmatics: http://www.asthmacommunitynetwork.org 4. American College of Allergy, Asthma, and Immunology: MissingWeapons.ca   Make sure you are registered to vote! If you have moved or changed any of your contact information, you will need to get this updated before voting!

## 2018-09-04 NOTE — Progress Notes (Signed)
VIALS EXP 09-06-19 

## 2018-09-04 NOTE — Addendum Note (Signed)
Addended by: Michel Harrow R on: 09/04/2018 03:31 PM   Modules accepted: Orders

## 2018-09-05 DIAGNOSIS — J3089 Other allergic rhinitis: Secondary | ICD-10-CM

## 2018-09-06 DIAGNOSIS — J301 Allergic rhinitis due to pollen: Secondary | ICD-10-CM

## 2018-09-09 LAB — CMP14+EGFR
ALK PHOS: 71 IU/L (ref 39–117)
ALT: 47 IU/L — AB (ref 0–32)
AST: 24 IU/L (ref 0–40)
Albumin/Globulin Ratio: 1.8 (ref 1.2–2.2)
Albumin: 4.8 g/dL (ref 3.5–5.5)
BILIRUBIN TOTAL: 0.3 mg/dL (ref 0.0–1.2)
BUN / CREAT RATIO: 17 (ref 9–23)
BUN: 14 mg/dL (ref 6–20)
CHLORIDE: 100 mmol/L (ref 96–106)
CO2: 22 mmol/L (ref 20–29)
CREATININE: 0.83 mg/dL (ref 0.57–1.00)
Calcium: 9.8 mg/dL (ref 8.7–10.2)
GFR calc Af Amer: 107 mL/min/{1.73_m2} (ref >59)
GFR calc non Af Amer: 93 mL/min/{1.73_m2} (ref >59)
Globulin, Total: 2.6 g/dL (ref 1.5–4.5)
Glucose: 84 mg/dL (ref 65–99)
Potassium: 4.8 mmol/L (ref 3.5–5.2)
Sodium: 139 mmol/L (ref 134–144)
Total Protein: 7.4 g/dL (ref 6.0–8.5)

## 2018-09-09 LAB — CBC WITH DIFFERENTIAL/PLATELET
BASOS ABS: 0.1 10*3/uL (ref 0.0–0.2)
Basos: 1 %
EOS (ABSOLUTE): 0.3 10*3/uL (ref 0.0–0.4)
Eos: 3 %
HEMOGLOBIN: 12.5 g/dL (ref 11.1–15.9)
Hematocrit: 37.4 % (ref 34.0–46.6)
Immature Grans (Abs): 0 10*3/uL (ref 0.0–0.1)
Immature Granulocytes: 0 %
LYMPHS ABS: 2.1 10*3/uL (ref 0.7–3.1)
LYMPHS: 25 %
MCH: 30.6 pg (ref 26.6–33.0)
MCHC: 33.4 g/dL (ref 31.5–35.7)
MCV: 91 fL (ref 79–97)
Monocytes Absolute: 0.6 10*3/uL (ref 0.1–0.9)
Monocytes: 8 %
NEUTROS ABS: 5.1 10*3/uL (ref 1.4–7.0)
Neutrophils: 63 %
PLATELETS: 448 10*3/uL (ref 150–450)
RBC: 4.09 x10E6/uL (ref 3.77–5.28)
RDW: 13.5 % (ref 12.3–15.4)
WBC: 8.1 10*3/uL (ref 3.4–10.8)

## 2018-09-09 LAB — C-REACTIVE PROTEIN: CRP: 6 mg/L (ref 0–10)

## 2018-09-09 LAB — ALPHA-GAL PANEL
Beef (Bos spp) IgE: <0.1 kU/L (ref <0.35)
Class Interpretation: 0
LAMB CLASS INTERPRETATION: 2
LAMB/MUTTON (OVIS SPP) IGE: 1.44 kU/L — AB (ref <0.35)
PORK CLASS INTERPRETATION: 2
Pork (Sus spp) IgE: 1.38 kU/L — ABNORMAL HIGH (ref <0.35)

## 2018-09-09 LAB — ANA: ANA: NEGATIVE

## 2018-09-09 LAB — SEDIMENTATION RATE: SED RATE: 12 mm/h (ref 0–32)

## 2018-09-18 ENCOUNTER — Ambulatory Visit: Payer: PRIVATE HEALTH INSURANCE

## 2018-09-20 ENCOUNTER — Ambulatory Visit (INDEPENDENT_AMBULATORY_CARE_PROVIDER_SITE_OTHER): Payer: PRIVATE HEALTH INSURANCE

## 2018-09-20 DIAGNOSIS — J309 Allergic rhinitis, unspecified: Secondary | ICD-10-CM | POA: Diagnosis not present

## 2018-09-20 MED ORDER — EPINEPHRINE 0.3 MG/0.3ML IJ SOAJ: 0.3000 mg | Freq: Once | INTRAMUSCULAR | 2 refills | Status: AC

## 2018-09-20 NOTE — Progress Notes (Signed)
Immunotherapy   Patient Details  Name: Jennifer [name] MRN: [id number] Date of Birth: October 28, 1986  09/20/2018  Jennifer [name] started allergy injections today. She received 0.05 of her blue vials with G-W-T-C-D and RW-Molds-DM. She waiting 30 minutes with no problem. Following schedule:  B Frequency: 1-2 times weekly Epi-Pen: Epi Pen sent in. Consent signed and patient instructions given.   Dub Mikes 09/20/2018, 9:06 AM

## 2018-09-25 ENCOUNTER — Ambulatory Visit (INDEPENDENT_AMBULATORY_CARE_PROVIDER_SITE_OTHER): Payer: PRIVATE HEALTH INSURANCE | Admitting: *Deleted

## 2018-09-25 DIAGNOSIS — J309 Allergic rhinitis, unspecified: Secondary | ICD-10-CM | POA: Diagnosis not present

## 2018-09-27 ENCOUNTER — Telehealth: Payer: Self-pay | Admitting: *Deleted

## 2018-09-27 NOTE — Telephone Encounter (Signed)
Called patient to inform her that her Xolair had arrived and set up appointment.  VM was full and unable to leave message.  Went ahead and made appointment for 10/04/18 at 3:00 pm in [place] office to start Xolair since patient is off work every other Friday per conversation 09/25/18 when she received her allergy injection.

## 2018-10-02 ENCOUNTER — Ambulatory Visit (INDEPENDENT_AMBULATORY_CARE_PROVIDER_SITE_OTHER): Payer: PRIVATE HEALTH INSURANCE | Admitting: *Deleted

## 2018-10-02 DIAGNOSIS — L501 Idiopathic urticaria: Secondary | ICD-10-CM

## 2018-10-02 MED ORDER — OMALIZUMAB 150 MG ~~LOC~~ SOLR
300.0000 mg | SUBCUTANEOUS | Status: DC
  Administered 2018-10-02 – 2019-12-31 (×17): 300 mg via SUBCUTANEOUS

## 2018-10-02 NOTE — Progress Notes (Signed)
Immunotherapy   Patient Details  Name: [name] [name] MRN: [id number] Date of Birth: Dec 11, 1985  10/02/2018  Patient started Xolair injections.  Patient received 300 mg today and will receive 300 mg every 28 days.  Patient does have Epi-Pen and has been instructed on proper use. Consent signed and reviewed medication and schedule with patient.  Patient waited 45 minutes after injection and no local or systemic reactions.    Shelba Flakelizabeth Jakorian Marengo 10/02/2018, 11:59 AM

## 2018-10-02 NOTE — Telephone Encounter (Signed)
Patient coming for Xolair injection 10/02/18 at 9:30 am.

## 2018-10-04 ENCOUNTER — Ambulatory Visit: Payer: PRIVATE HEALTH INSURANCE

## 2018-10-09 ENCOUNTER — Ambulatory Visit (INDEPENDENT_AMBULATORY_CARE_PROVIDER_SITE_OTHER): Payer: PRIVATE HEALTH INSURANCE | Admitting: *Deleted

## 2018-10-09 DIAGNOSIS — J309 Allergic rhinitis, unspecified: Secondary | ICD-10-CM

## 2018-10-16 ENCOUNTER — Ambulatory Visit (INDEPENDENT_AMBULATORY_CARE_PROVIDER_SITE_OTHER): Payer: PRIVATE HEALTH INSURANCE

## 2018-10-16 DIAGNOSIS — J309 Allergic rhinitis, unspecified: Secondary | ICD-10-CM | POA: Diagnosis not present

## 2018-10-23 ENCOUNTER — Ambulatory Visit (INDEPENDENT_AMBULATORY_CARE_PROVIDER_SITE_OTHER): Payer: PRIVATE HEALTH INSURANCE

## 2018-10-23 DIAGNOSIS — J309 Allergic rhinitis, unspecified: Secondary | ICD-10-CM | POA: Diagnosis not present

## 2018-10-30 ENCOUNTER — Ambulatory Visit (INDEPENDENT_AMBULATORY_CARE_PROVIDER_SITE_OTHER): Payer: PRIVATE HEALTH INSURANCE

## 2018-10-30 DIAGNOSIS — L501 Idiopathic urticaria: Secondary | ICD-10-CM

## 2018-11-27 ENCOUNTER — Ambulatory Visit (INDEPENDENT_AMBULATORY_CARE_PROVIDER_SITE_OTHER): Payer: PRIVATE HEALTH INSURANCE | Admitting: *Deleted

## 2018-11-27 DIAGNOSIS — L501 Idiopathic urticaria: Secondary | ICD-10-CM | POA: Diagnosis not present

## 2018-11-29 ENCOUNTER — Encounter: Payer: Self-pay | Admitting: Allergy & Immunology

## 2018-11-29 ENCOUNTER — Ambulatory Visit: Payer: PRIVATE HEALTH INSURANCE | Admitting: Allergy & Immunology

## 2018-11-29 VITALS — BP 104/70 | HR 78 | Temp 98.3°F | Resp 16

## 2018-11-29 DIAGNOSIS — J3089 Other allergic rhinitis: Secondary | ICD-10-CM

## 2018-11-29 DIAGNOSIS — J302 Other seasonal allergic rhinitis: Secondary | ICD-10-CM

## 2018-11-29 DIAGNOSIS — J4520 Mild intermittent asthma, uncomplicated: Secondary | ICD-10-CM

## 2018-11-29 DIAGNOSIS — J069 Acute upper respiratory infection, unspecified: Secondary | ICD-10-CM | POA: Diagnosis not present

## 2018-11-29 DIAGNOSIS — L501 Idiopathic urticaria: Secondary | ICD-10-CM | POA: Diagnosis not present

## 2018-11-29 NOTE — Patient Instructions (Addendum)
1. Allergic urticaria - Continue with Xolair 300mg  monthly. - Continue with cetirizine and Singulair.   2. Allergic rhinitis/allergic conjunctivitis - Continue with Dymista 1-2 sprays per nostril up to twice daily. - Continue with Singulair (montelukast) 10mg  daily.   - Continue with Zatidor eye drops as needed.  - Continue with allergy shots.   3. Mild intermittent asthma  - Lung testing looks good today. - We will add back on the Singulair 10mg  daily. - Continue Proventil 2 puffs every 4 hours as needed for cough or wheeze  4. Sore throat - Add on Cepacol lozenges (samples provided). - Add on Mucinex 600-1200mg  twice daily (samples provided).  - Add on nasal saline rinses.  - Call Monday with an update.   5. Return in about 3 months (around 02/28/2019).    Please inform us of any Emergency Department visits, hospitalizations, or changes in symptoms. Call us before going to the ED for breathing or allergy symptoms since we might be able to fit you in for a sick visit. Feel free to contact us anytime with any questions, problems, or concerns.  It was a pleasure to see you again today!  Websites that have reliable patient information: 1. American Academy of Asthma, Allergy, and Immunology: www.aaaai.org 2. Food Allergy Research and Education (FARE): foodallergy.org 3. Mothers of Asthmatics: http://www.asthmacommunitynetwork.org 4. American College of Allergy, Asthma, and Immunology: MissingWeapons.ca   Make sure you are registered to vote! If you have moved or changed any of your contact information, you will need to get this updated before voting!    Voter ID laws are going into effect for the General Election in November 2020! Be prepared! Check out [employer].dk for more details.

## 2018-11-29 NOTE — Progress Notes (Signed)
FOLLOW UP  Date of Service/Encounter:  11/29/18   Assessment:   Mild intermittent asthma without complication  Chronic idiopathic urticaria - doing well on Xolair  Seasonal and perennial allergic rhinitis (grasses, weeds, trees, indoor and outdoor molds, dust mite, dog, cat, horse, mouse, and tobacco) - on allergen immunotherapy    URI with sore throat - likely viral mediated  Plan/Recommendations:   1. Allergic urticaria - Continue with Xolair 300mg  monthly. - Continue with cetirizine and Singulair.   2. Allergic rhinitis/allergic conjunctivitis - Continue with Dymista 1-2 sprays per nostril up to twice daily. - Continue with Singulair (montelukast) 10mg  daily.   - Continue with Zatidor eye drops as needed.  - Continue with allergy shots.   3. Mild intermittent asthma  - Lung testing looks good today. - We will add back on the Singulair 10mg  daily. - Continue Proventil 2 puffs every 4 hours as needed for cough or wheeze  4. Sore throat - Add on Cepacol lozenges (samples provided). - Add on Mucinex 600-1200mg  twice daily (samples provided).  - Add on nasal saline rinses.  - Call Monday with an update.   5. Return in about 3 months (around 02/28/2019).  Subjective:   Jennifer [name] is a 33 y.o. female presenting today for follow up of  Chief Complaint  Patient presents with  . Asthma    Jennifer [name] has a history of the following: There are no active problems to display for this patient.   History obtained from: chart review and patient.  Jennifer [name] Primary Care Provider is Jennifer Flavin, MD.     Jennifer [name] is a 33 y.o. female presenting for a follow up visit.  She was last seen in October 2019.  At that time, her urticaria was not well controlled.  We stopped the ranitidine and increased her cetirizine to 20 mg at night and 10 mg in the morning.  We also added on Singulair.  We did get some labs to rule out serious causes of hives which showed a positive  IgE to lamb as well as pork, although the IgE to alpha gal was negative.  We also initiated Xolair at that visit.  She was also interested in starting allergy shots.   Since the last visit, she has mostly done well. She does report that she has been getting a tickle in her throat. She denies fevers but she tells me that all of her coworkers are coming to work sick. She has not tried taking anything in particular for these symptoms. She is not using any lozenges or other   Asthma/Respiratory Symptom History: She did have some transient chest tightness two months ago that cleared up on its own. She has not needed steroids at all since the last visit. ACT is 23, indicating excellent asthma control. She has not needed steroids since the last visit at all.   Allergic Rhinitis Symptom History: She has not gotten a shot since she has been getting her allergy shots because of that. Winter is typically worse for her. She does get some illness fairly routinely. She had a viral illness for one month one year ago and was a lot of steroids. But this year has been better.   She is going to set up a sleep study soon and then she might go to see an ENT after that for tonsillectomy. She does not have a relationship with an ENT at this point.   Otherwise, there have been no changes to  her past medical history, surgical history, family history, or social history.    Review of Systems: a 14-point review of systems is pertinent for what is mentioned in HPI.  Otherwise, all other systems were negative.  Constitutional: negative other than that listed in the HPI Eyes: negative other than that listed in the HPI Ears, nose, mouth, throat, and face: negative other than that listed in the HPI Respiratory: negative other than that listed in the HPI Cardiovascular: negative other than that listed in the HPI Gastrointestinal: negative other than that listed in the HPI Genitourinary: negative other than that listed in the  HPI Integument: negative other than that listed in the HPI Hematologic: negative other than that listed in the HPI Musculoskeletal: negative other than that listed in the HPI Neurological: negative other than that listed in the HPI Allergy/Immunologic: negative other than that listed in the HPI    Objective:   Blood pressure 104/70, pulse 78, temperature 98.3 F (36.8 C), temperature source Oral, resp. rate 16, SpO2 99 %. There is no height or weight on file to calculate BMI.   Physical Exam:  General: Alert, interactive, in no acute distress. Pleasant and smiling.  Eyes: No conjunctival injection bilaterally, no discharge on the right, no discharge on the left and no Horner-Trantas dots present. PERRL bilaterally. EOMI without pain. No photophobia.  Ears: Right TM pearly gray with normal light reflex, Left TM pearly gray with normal light reflex, Right TM intact without perforation and Left TM intact without perforation.  Nose/Throat: External nose within normal limits and septum midline. Turbinates edematous and pale with clear discharge. Posterior oropharynx erythematous with cobblestoning in the posterior oropharynx. Tonsils 2+ without exudates.  Tongue without thrush. Lungs: Clear to auscultation without wheezing, rhonchi or rales. No increased work of breathing. CV: Normal S1/S2. No murmurs. Capillary refill <2 seconds.  Skin: Warm and dry, without lesions or rashes. Neuro:   Grossly intact. No focal deficits appreciated. Responsive to questions.  Diagnostic studies:   Spirometry: results normal (FEV1: 2.86/105%, FVC: 4.37/126%, FEV1/FVC: 65%).    Spirometry consistent with mild obstructive disease.   Allergy Studies: none       Malachi Bonds, MD  [facility]

## 2018-12-06 ENCOUNTER — Ambulatory Visit: Payer: PRIVATE HEALTH INSURANCE | Admitting: Allergy & Immunology

## 2018-12-06 ENCOUNTER — Ambulatory Visit (INDEPENDENT_AMBULATORY_CARE_PROVIDER_SITE_OTHER): Payer: PRIVATE HEALTH INSURANCE

## 2018-12-06 DIAGNOSIS — J309 Allergic rhinitis, unspecified: Secondary | ICD-10-CM | POA: Diagnosis not present

## 2018-12-11 ENCOUNTER — Ambulatory Visit (INDEPENDENT_AMBULATORY_CARE_PROVIDER_SITE_OTHER): Payer: PRIVATE HEALTH INSURANCE | Admitting: *Deleted

## 2018-12-11 DIAGNOSIS — J309 Allergic rhinitis, unspecified: Secondary | ICD-10-CM | POA: Diagnosis not present

## 2018-12-13 ENCOUNTER — Ambulatory Visit (INDEPENDENT_AMBULATORY_CARE_PROVIDER_SITE_OTHER): Payer: PRIVATE HEALTH INSURANCE

## 2018-12-13 DIAGNOSIS — J309 Allergic rhinitis, unspecified: Secondary | ICD-10-CM

## 2018-12-18 ENCOUNTER — Ambulatory Visit (INDEPENDENT_AMBULATORY_CARE_PROVIDER_SITE_OTHER): Payer: PRIVATE HEALTH INSURANCE | Admitting: *Deleted

## 2018-12-18 DIAGNOSIS — J309 Allergic rhinitis, unspecified: Secondary | ICD-10-CM

## 2018-12-25 ENCOUNTER — Ambulatory Visit: Payer: PRIVATE HEALTH INSURANCE

## 2019-01-01 ENCOUNTER — Ambulatory Visit (INDEPENDENT_AMBULATORY_CARE_PROVIDER_SITE_OTHER): Payer: PRIVATE HEALTH INSURANCE

## 2019-01-01 DIAGNOSIS — J309 Allergic rhinitis, unspecified: Secondary | ICD-10-CM

## 2019-01-08 ENCOUNTER — Ambulatory Visit (INDEPENDENT_AMBULATORY_CARE_PROVIDER_SITE_OTHER): Payer: PRIVATE HEALTH INSURANCE

## 2019-01-08 DIAGNOSIS — L501 Idiopathic urticaria: Secondary | ICD-10-CM | POA: Diagnosis not present

## 2019-01-15 ENCOUNTER — Ambulatory Visit (INDEPENDENT_AMBULATORY_CARE_PROVIDER_SITE_OTHER): Payer: PRIVATE HEALTH INSURANCE | Admitting: *Deleted

## 2019-01-15 DIAGNOSIS — J309 Allergic rhinitis, unspecified: Secondary | ICD-10-CM | POA: Diagnosis not present

## 2019-01-22 ENCOUNTER — Ambulatory Visit (INDEPENDENT_AMBULATORY_CARE_PROVIDER_SITE_OTHER): Payer: PRIVATE HEALTH INSURANCE

## 2019-01-22 DIAGNOSIS — J309 Allergic rhinitis, unspecified: Secondary | ICD-10-CM | POA: Diagnosis not present

## 2019-01-24 ENCOUNTER — Ambulatory Visit (INDEPENDENT_AMBULATORY_CARE_PROVIDER_SITE_OTHER): Payer: PRIVATE HEALTH INSURANCE

## 2019-01-24 DIAGNOSIS — J309 Allergic rhinitis, unspecified: Secondary | ICD-10-CM | POA: Diagnosis not present

## 2019-01-29 ENCOUNTER — Ambulatory Visit (INDEPENDENT_AMBULATORY_CARE_PROVIDER_SITE_OTHER): Payer: PRIVATE HEALTH INSURANCE

## 2019-01-29 DIAGNOSIS — J309 Allergic rhinitis, unspecified: Secondary | ICD-10-CM | POA: Diagnosis not present

## 2019-02-05 ENCOUNTER — Ambulatory Visit (INDEPENDENT_AMBULATORY_CARE_PROVIDER_SITE_OTHER): Payer: PRIVATE HEALTH INSURANCE | Admitting: *Deleted

## 2019-02-05 ENCOUNTER — Other Ambulatory Visit: Payer: Self-pay

## 2019-02-05 DIAGNOSIS — L501 Idiopathic urticaria: Secondary | ICD-10-CM

## 2019-02-07 ENCOUNTER — Ambulatory Visit (INDEPENDENT_AMBULATORY_CARE_PROVIDER_SITE_OTHER): Payer: PRIVATE HEALTH INSURANCE

## 2019-02-07 DIAGNOSIS — J309 Allergic rhinitis, unspecified: Secondary | ICD-10-CM | POA: Diagnosis not present

## 2019-02-12 ENCOUNTER — Ambulatory Visit (INDEPENDENT_AMBULATORY_CARE_PROVIDER_SITE_OTHER): Payer: PRIVATE HEALTH INSURANCE | Admitting: *Deleted

## 2019-02-12 DIAGNOSIS — J309 Allergic rhinitis, unspecified: Secondary | ICD-10-CM

## 2019-02-14 ENCOUNTER — Ambulatory Visit (INDEPENDENT_AMBULATORY_CARE_PROVIDER_SITE_OTHER): Payer: PRIVATE HEALTH INSURANCE

## 2019-02-14 DIAGNOSIS — J309 Allergic rhinitis, unspecified: Secondary | ICD-10-CM

## 2019-02-19 ENCOUNTER — Ambulatory Visit (INDEPENDENT_AMBULATORY_CARE_PROVIDER_SITE_OTHER): Payer: PRIVATE HEALTH INSURANCE

## 2019-02-19 DIAGNOSIS — J309 Allergic rhinitis, unspecified: Secondary | ICD-10-CM

## 2019-03-05 ENCOUNTER — Ambulatory Visit (INDEPENDENT_AMBULATORY_CARE_PROVIDER_SITE_OTHER): Payer: PRIVATE HEALTH INSURANCE | Admitting: Allergy & Immunology

## 2019-03-05 ENCOUNTER — Other Ambulatory Visit: Payer: Self-pay

## 2019-03-05 ENCOUNTER — Ambulatory Visit: Payer: Self-pay

## 2019-03-05 ENCOUNTER — Encounter: Payer: Self-pay | Admitting: Allergy & Immunology

## 2019-03-05 VITALS — BP 122/74 | HR 98 | Temp 97.9°F | Resp 16

## 2019-03-05 DIAGNOSIS — J4520 Mild intermittent asthma, uncomplicated: Secondary | ICD-10-CM

## 2019-03-05 DIAGNOSIS — J3089 Other allergic rhinitis: Secondary | ICD-10-CM | POA: Diagnosis not present

## 2019-03-05 DIAGNOSIS — L501 Idiopathic urticaria: Secondary | ICD-10-CM

## 2019-03-05 DIAGNOSIS — J302 Other seasonal allergic rhinitis: Secondary | ICD-10-CM

## 2019-03-05 NOTE — Progress Notes (Signed)
FOLLOW UP  Date of Service/Encounter:  03/05/19   Assessment:   Mild intermittent asthma without complication  Chronic idiopathic urticaria-doing well on Xolair  Seasonal and perennial allergic rhinitis(grasses, weeds, trees, indoor and outdoor molds, dust mite, dog, cat, horse, mouse, and tobacco) -on allergen immunotherapy   Recently furloughed   Plan/Recommendations:   1. Allergic urticaria - well controlled - Continue with Xolair monthly. - Continue with cetirizine and Singulair.   2. Allergic rhinitis/allergic conjunctivitis - Continue with Dymista 1-2 sprays per nostril up to twice daily. - Continue with Singulair (montelukast) 10mg  daily.   - Continue with Zatidor eye drops as needed.  - Continue with allergy shots (especially since you got a dog!)  3. Mild intermittent asthma  - We did not do a breathing test since this can spread the coronavirus. - Continue with Singulair 10mg  daily.  - Continue Proventil 2 puffs every 4 hours as needed for cough or wheeze  4. Return in about 6 months (around 09/04/2019). This can be an in-person, a virtual Webex or a telephone follow up visit.   Subjective:   Jennifer [name] is a 33 y.o. female presenting today for follow up of  Chief Complaint  Patient presents with   Asthma    Jennifer Cordsnn Marie Schnapp has a history of the following: There are no active problems to display for this patient.   History obtained from: chart review and patient.  Jennifer [name] is a 33 y.o. female presenting for a follow up visit.  She was last seen in January 2020.  At that time, we continued Xolair 300 mg monthly for her urticaria.  We also continue with cetirizine and Singulair.  For her allergic rhinitis, we continue Dymista, Singulair, and Zaditor eyedrops as well as allergy shots.  Her asthma was under good control with Singulair and Proventil as needed.  She did have a sore throat and we added on some over-the-counter medications to see if this  would provide any relief.  Since last visit, unfortunately, things not going well.  She was furloughed on March 25.  They are expecting them to start again around June 1.  She works at [employer].  Mother paying for her insurance during the furlough, the are going to be expecting her to pay it back when she starts working again.  She has had trouble applying for unemployment.  This was preceded by her breaking up with a boyfriend.  This has caused more stress in her life.    Asthma/Respiratory Symptom History: From an asthma perspective things have gone well.  She remains on Singulair 10 mg daily as well as albuterol every 4 hours as needed.  Things have gone fairly well.  She was going to refill her albuterol but it was nearly $50 so she held off since she did not need it right away.  She has required no prednisone at all.  She has not been to the ER.  Spring always seems to flare her symptoms, so this is not a surprise for her.  Allergic Rhinitis Symptom History: She remains on her allergy shots.  She is getting fluticasone and Astelin nasal sprays up to twice daily.  She is also on the Singulair.  She is using [place] eyedrops as needed.  Allergy shots are gone well.  She has had no adverse reactions to these aside from some large localized swelling once.  She is in her Yellow Vial. Overall symptoms are better controlled on the allergy shots. She recently  got a dog (brindle pitbull named Pepper) and the allergy shots have allowed her the ability to tolerate the dog.   Urticaria Symptom History: Her hives have been well controlled with the Xolair 300 mg monthly.  She will have intermittent breakthroughs, but she does not even treat these.  Obviously the dog is been a big trigger for her hives, but again they are transient.  She definitely feels much better on the Xolair than she did before she started it.  Since the last visit, she has had a sleep study and was diagnosed with sleep apnea.  She is  on CPAP with a pressure of 13, which she feels might be a little overly aggressive.  She is hoping to get 1 of the CPAP machines that auto titrate.  Unfortunately, she is a mouth breather and cannot use the nasal only device.  Otherwise, there have been no changes to her past medical history, surgical history, family history, or social history.    Review of Systems  Constitutional: Negative.  Negative for chills, fever, malaise/fatigue and weight loss.  HENT: Negative.  Negative for congestion, ear discharge, ear pain, nosebleeds, sinus pain and sore throat.   Eyes: Negative for pain, discharge and redness.  Respiratory: Negative for cough, sputum production, shortness of breath and wheezing.   Cardiovascular: Negative.  Negative for chest pain and palpitations.  Gastrointestinal: Negative for abdominal pain, heartburn, nausea and vomiting.  Skin: Negative.  Negative for itching and rash.  Neurological: Negative for dizziness and headaches.  Endo/Heme/Allergies: Negative for environmental allergies. Does not bruise/bleed easily.       Objective:   Blood pressure 122/74, pulse 98, temperature 97.9 F (36.6 C), temperature source Oral, resp. rate 16, SpO2 98 %. There is no height or weight on file to calculate BMI.   Physical Exam:  Physical Exam  Constitutional: She appears well-developed.  Seems to be in fairly good spirits, but clearly dejected.  HENT:  Head: Normocephalic and atraumatic.  Right Ear: Tympanic membrane, external ear and ear canal normal. No drainage, swelling or tenderness. Tympanic membrane is not injected, not scarred, not erythematous, not retracted and not bulging.  Left Ear: Tympanic membrane, external ear and ear canal normal. No drainage, swelling or tenderness. Tympanic membrane is not injected, not scarred, not erythematous, not retracted and not bulging.  Nose: Mucosal edema and rhinorrhea present. No nasal deformity or septal deviation. No epistaxis.  Right sinus exhibits no maxillary sinus tenderness and no frontal sinus tenderness. Left sinus exhibits no maxillary sinus tenderness and no frontal sinus tenderness.  Mouth/Throat: Uvula is midline and oropharynx is clear and moist. Mucous membranes are not pale and not dry.  Cobblestoning less evident today.  Eyes: Pupils are equal, round, and reactive to light. Conjunctivae and EOM are normal. Right eye exhibits no chemosis and no discharge. Left eye exhibits no chemosis and no discharge. Right conjunctiva is not injected. Left conjunctiva is not injected.  Allergic shiners bilaterally.  Cardiovascular: Normal rate, regular rhythm and normal heart sounds.  Respiratory: Effort normal and breath sounds normal. No accessory muscle usage. No tachypnea. No respiratory distress. She has no wheezes. She has no rhonchi. She has no rales. She exhibits no tenderness.  GI: There is no abdominal tenderness. There is no rebound and no guarding.  Lymphadenopathy:       Head (right side): No submandibular, no tonsillar and no occipital adenopathy present.       Head (left side): No submandibular, no tonsillar and no  occipital adenopathy present.    She has no cervical adenopathy.  Neurological: She is alert.  Skin: No abrasion, no petechiae and no rash noted. Rash is not papular, not vesicular and not urticarial. No erythema. No pallor.  Psychiatric: She has a normal mood and affect.     Diagnostic studies: none    Malachi Bonds, MD  [facility]

## 2019-03-05 NOTE — Patient Instructions (Addendum)
1. Allergic urticaria - well controlled - Continue with Xolair monthly. - Continue with cetirizine and Singulair.   2. Allergic rhinitis/allergic conjunctivitis - Continue with Dymista 1-2 sprays per nostril up to twice daily. - Continue with Singulair (montelukast) 10mg  daily.   - Continue with Zatidor eye drops as needed.  - Continue with allergy shots (especially since you got a dog!)  3. Mild intermittent asthma  - We did not do a breathing test since this can spread the coronavirus. - Continue with Singulair 10mg  daily.  - Continue Proventil 2 puffs every 4 hours as needed for cough or wheeze  4. Return in about 6 months (around 09/04/2019). This can be an in-person, a virtual Webex or a telephone follow up visit.   Please inform us of any Emergency Department visits, hospitalizations, or changes in symptoms. Call us before going to the ED for breathing or allergy symptoms since we might be able to fit you in for a sick visit. Feel free to contact us anytime with any questions, problems, or concerns.  It was a pleasure to see you again today! Good luck with the unemployment application process. Let us know if you need anything at all!   Websites that have reliable patient information: 1. American Academy of Asthma, Allergy, and Immunology: www.aaaai.org 2. Food Allergy Research and Education (FARE): foodallergy.org. 3. Mothers of Asthmatics: http://www.asthmacommunitynetwork.org 4. American College of Allergy, Asthma, and Immunology: www.acaai.org  "Like" [place] on Facebook and Instagram for our latest updates!      Make sure you are registered to vote! If you have moved or changed any of your contact information, you will need to get this updated before voting!    Voter ID laws are NOT going into effect for the General Election in November 2020! DO NOT let this stop you from exercising your right to vote!

## 2019-03-06 ENCOUNTER — Other Ambulatory Visit: Payer: Self-pay | Admitting: Allergy & Immunology

## 2019-03-07 ENCOUNTER — Ambulatory Visit (INDEPENDENT_AMBULATORY_CARE_PROVIDER_SITE_OTHER): Payer: PRIVATE HEALTH INSURANCE

## 2019-03-07 DIAGNOSIS — J309 Allergic rhinitis, unspecified: Secondary | ICD-10-CM

## 2019-03-12 ENCOUNTER — Ambulatory Visit (INDEPENDENT_AMBULATORY_CARE_PROVIDER_SITE_OTHER): Payer: PRIVATE HEALTH INSURANCE

## 2019-03-12 DIAGNOSIS — J309 Allergic rhinitis, unspecified: Secondary | ICD-10-CM | POA: Diagnosis not present

## 2019-03-14 ENCOUNTER — Ambulatory Visit (INDEPENDENT_AMBULATORY_CARE_PROVIDER_SITE_OTHER): Payer: PRIVATE HEALTH INSURANCE

## 2019-03-14 DIAGNOSIS — J309 Allergic rhinitis, unspecified: Secondary | ICD-10-CM

## 2019-03-19 ENCOUNTER — Ambulatory Visit (INDEPENDENT_AMBULATORY_CARE_PROVIDER_SITE_OTHER): Payer: PRIVATE HEALTH INSURANCE

## 2019-03-19 DIAGNOSIS — J309 Allergic rhinitis, unspecified: Secondary | ICD-10-CM

## 2019-03-26 ENCOUNTER — Ambulatory Visit (INDEPENDENT_AMBULATORY_CARE_PROVIDER_SITE_OTHER): Payer: PRIVATE HEALTH INSURANCE | Admitting: *Deleted

## 2019-03-26 DIAGNOSIS — J309 Allergic rhinitis, unspecified: Secondary | ICD-10-CM | POA: Diagnosis not present

## 2019-04-02 ENCOUNTER — Ambulatory Visit (INDEPENDENT_AMBULATORY_CARE_PROVIDER_SITE_OTHER): Payer: PRIVATE HEALTH INSURANCE

## 2019-04-02 ENCOUNTER — Other Ambulatory Visit: Payer: Self-pay

## 2019-04-02 DIAGNOSIS — L501 Idiopathic urticaria: Secondary | ICD-10-CM

## 2019-04-04 ENCOUNTER — Ambulatory Visit (INDEPENDENT_AMBULATORY_CARE_PROVIDER_SITE_OTHER): Payer: PRIVATE HEALTH INSURANCE

## 2019-04-04 DIAGNOSIS — J309 Allergic rhinitis, unspecified: Secondary | ICD-10-CM | POA: Diagnosis not present

## 2019-04-11 ENCOUNTER — Ambulatory Visit (INDEPENDENT_AMBULATORY_CARE_PROVIDER_SITE_OTHER): Payer: PRIVATE HEALTH INSURANCE

## 2019-04-11 DIAGNOSIS — J309 Allergic rhinitis, unspecified: Secondary | ICD-10-CM | POA: Diagnosis not present

## 2019-04-18 ENCOUNTER — Ambulatory Visit (INDEPENDENT_AMBULATORY_CARE_PROVIDER_SITE_OTHER): Payer: PRIVATE HEALTH INSURANCE

## 2019-04-18 DIAGNOSIS — J309 Allergic rhinitis, unspecified: Secondary | ICD-10-CM

## 2019-04-23 ENCOUNTER — Ambulatory Visit (INDEPENDENT_AMBULATORY_CARE_PROVIDER_SITE_OTHER): Payer: PRIVATE HEALTH INSURANCE

## 2019-04-23 DIAGNOSIS — J309 Allergic rhinitis, unspecified: Secondary | ICD-10-CM

## 2019-04-30 ENCOUNTER — Ambulatory Visit (INDEPENDENT_AMBULATORY_CARE_PROVIDER_SITE_OTHER): Payer: PRIVATE HEALTH INSURANCE

## 2019-04-30 DIAGNOSIS — L501 Idiopathic urticaria: Secondary | ICD-10-CM | POA: Diagnosis not present

## 2019-05-02 ENCOUNTER — Ambulatory Visit (INDEPENDENT_AMBULATORY_CARE_PROVIDER_SITE_OTHER): Payer: PRIVATE HEALTH INSURANCE

## 2019-05-02 DIAGNOSIS — J309 Allergic rhinitis, unspecified: Secondary | ICD-10-CM

## 2019-05-09 ENCOUNTER — Ambulatory Visit (INDEPENDENT_AMBULATORY_CARE_PROVIDER_SITE_OTHER): Payer: PRIVATE HEALTH INSURANCE

## 2019-05-09 DIAGNOSIS — J309 Allergic rhinitis, unspecified: Secondary | ICD-10-CM

## 2019-05-16 ENCOUNTER — Ambulatory Visit (INDEPENDENT_AMBULATORY_CARE_PROVIDER_SITE_OTHER): Payer: PRIVATE HEALTH INSURANCE

## 2019-05-16 DIAGNOSIS — J309 Allergic rhinitis, unspecified: Secondary | ICD-10-CM | POA: Diagnosis not present

## 2019-05-28 ENCOUNTER — Ambulatory Visit: Payer: Self-pay

## 2019-05-28 ENCOUNTER — Other Ambulatory Visit: Payer: Self-pay

## 2019-05-28 ENCOUNTER — Ambulatory Visit (INDEPENDENT_AMBULATORY_CARE_PROVIDER_SITE_OTHER): Payer: PRIVATE HEALTH INSURANCE

## 2019-05-28 DIAGNOSIS — L501 Idiopathic urticaria: Secondary | ICD-10-CM

## 2019-05-30 ENCOUNTER — Ambulatory Visit (INDEPENDENT_AMBULATORY_CARE_PROVIDER_SITE_OTHER): Payer: PRIVATE HEALTH INSURANCE

## 2019-05-30 ENCOUNTER — Other Ambulatory Visit: Payer: Self-pay

## 2019-05-30 DIAGNOSIS — J309 Allergic rhinitis, unspecified: Secondary | ICD-10-CM | POA: Diagnosis not present

## 2019-06-04 ENCOUNTER — Other Ambulatory Visit: Payer: Self-pay

## 2019-06-04 ENCOUNTER — Ambulatory Visit (INDEPENDENT_AMBULATORY_CARE_PROVIDER_SITE_OTHER): Payer: PRIVATE HEALTH INSURANCE

## 2019-06-04 DIAGNOSIS — J309 Allergic rhinitis, unspecified: Secondary | ICD-10-CM

## 2019-06-13 ENCOUNTER — Other Ambulatory Visit: Payer: Self-pay

## 2019-06-13 ENCOUNTER — Ambulatory Visit (INDEPENDENT_AMBULATORY_CARE_PROVIDER_SITE_OTHER): Payer: PRIVATE HEALTH INSURANCE

## 2019-06-13 DIAGNOSIS — J309 Allergic rhinitis, unspecified: Secondary | ICD-10-CM | POA: Diagnosis not present

## 2019-06-25 ENCOUNTER — Ambulatory Visit: Payer: Self-pay

## 2019-06-25 ENCOUNTER — Other Ambulatory Visit: Payer: Self-pay

## 2019-06-25 ENCOUNTER — Ambulatory Visit (INDEPENDENT_AMBULATORY_CARE_PROVIDER_SITE_OTHER): Payer: PRIVATE HEALTH INSURANCE

## 2019-06-25 DIAGNOSIS — L501 Idiopathic urticaria: Secondary | ICD-10-CM

## 2019-07-02 ENCOUNTER — Other Ambulatory Visit: Payer: Self-pay

## 2019-07-02 ENCOUNTER — Ambulatory Visit (INDEPENDENT_AMBULATORY_CARE_PROVIDER_SITE_OTHER): Payer: Self-pay

## 2019-07-02 DIAGNOSIS — J309 Allergic rhinitis, unspecified: Secondary | ICD-10-CM

## 2019-07-03 DIAGNOSIS — J301 Allergic rhinitis due to pollen: Secondary | ICD-10-CM

## 2019-07-03 NOTE — Progress Notes (Signed)
VIALS EXP 07-02-2020 

## 2019-07-21 ENCOUNTER — Telehealth: Payer: Self-pay | Admitting: *Deleted

## 2019-07-21 NOTE — Telephone Encounter (Signed)
Per [facility] pharmacy advising unable to ship Xolair for upcoming appt due to patient's Ins has termed.  I tried to contact patient but her voicemail was full

## 2019-07-23 ENCOUNTER — Telehealth: Payer: Self-pay | Admitting: *Deleted

## 2019-07-23 ENCOUNTER — Ambulatory Visit (INDEPENDENT_AMBULATORY_CARE_PROVIDER_SITE_OTHER): Payer: BC Managed Care – PPO | Admitting: *Deleted

## 2019-07-23 ENCOUNTER — Ambulatory Visit: Payer: Self-pay

## 2019-07-23 DIAGNOSIS — J309 Allergic rhinitis, unspecified: Secondary | ICD-10-CM | POA: Diagnosis not present

## 2019-07-23 NOTE — Telephone Encounter (Signed)
Patient had called yesterday with new insurance.  I advised her I would get PA approval and reach back out to R/S her appt she had for today. I have her approval and we will switch her over to buy and bill now.  I did try to reach out to patient to make her appt for Xolair injections but her voicemail is full and unable to leave message

## 2019-08-01 ENCOUNTER — Ambulatory Visit (INDEPENDENT_AMBULATORY_CARE_PROVIDER_SITE_OTHER): Payer: BC Managed Care – PPO

## 2019-08-01 DIAGNOSIS — J309 Allergic rhinitis, unspecified: Secondary | ICD-10-CM

## 2019-08-15 ENCOUNTER — Ambulatory Visit (INDEPENDENT_AMBULATORY_CARE_PROVIDER_SITE_OTHER): Payer: BC Managed Care – PPO | Admitting: *Deleted

## 2019-08-15 DIAGNOSIS — J309 Allergic rhinitis, unspecified: Secondary | ICD-10-CM | POA: Diagnosis not present

## 2019-08-19 DIAGNOSIS — L501 Idiopathic urticaria: Secondary | ICD-10-CM | POA: Diagnosis not present

## 2019-08-20 ENCOUNTER — Ambulatory Visit (INDEPENDENT_AMBULATORY_CARE_PROVIDER_SITE_OTHER): Payer: BC Managed Care – PPO

## 2019-08-20 DIAGNOSIS — L501 Idiopathic urticaria: Secondary | ICD-10-CM

## 2019-08-22 ENCOUNTER — Ambulatory Visit (INDEPENDENT_AMBULATORY_CARE_PROVIDER_SITE_OTHER): Payer: BC Managed Care – PPO

## 2019-08-22 DIAGNOSIS — R300 Dysuria: Secondary | ICD-10-CM | POA: Diagnosis not present

## 2019-08-22 DIAGNOSIS — J309 Allergic rhinitis, unspecified: Secondary | ICD-10-CM

## 2019-08-31 ENCOUNTER — Encounter (HOSPITAL_COMMUNITY): Payer: Self-pay

## 2019-08-31 ENCOUNTER — Other Ambulatory Visit: Payer: Self-pay

## 2019-08-31 ENCOUNTER — Ambulatory Visit (HOSPITAL_COMMUNITY)
Admission: EM | Admit: 2019-08-31 | Discharge: 2019-08-31 | Disposition: A | Discharge status: Home / Self Care | Payer: Worker's Compensation | Attending: Emergency Medicine | Admitting: Emergency Medicine

## 2019-08-31 ENCOUNTER — Ambulatory Visit (INDEPENDENT_AMBULATORY_CARE_PROVIDER_SITE_OTHER): Payer: Worker's Compensation

## 2019-08-31 DIAGNOSIS — W010XXA Fall on same level from slipping, tripping and stumbling without subsequent striking against object, initial encounter: Secondary | ICD-10-CM | POA: Diagnosis not present

## 2019-08-31 DIAGNOSIS — M25572 Pain in left ankle and joints of left foot: Secondary | ICD-10-CM

## 2019-08-31 MED ORDER — IBUPROFEN 800 MG PO TABS: 800.0000 mg | ORAL_TABLET | Freq: Three times a day (TID) | ORAL | 0 refills | Status: DC | PRN

## 2019-08-31 NOTE — ED Provider Notes (Signed)
[facility]    CSN: [id number] Arrival date & time: 08/31/19  1625      History   Chief Complaint Chief Complaint  Patient presents with  . Leg Injury    LEFT LEG    HPI Jennifer [name] is a 33 y.o. female.   Patient presents with left ankle pain.  She was working today and tripped over a cable on the floor.  She landed on her left ankle.  She reports tingling in her foot.  She is able to bear weight and has full range of motion.  She denies head injury or LOC.  LMP: On continuous OCP.  The history is provided by the patient.    Past Medical History:  Diagnosis Date  . Anxiety   . Asthma   . Eczema   . GERD (gastroesophageal reflux disease)   . GERD (gastroesophageal reflux disease)   . Thyroid disease   . Thyroid disease   . Urticaria     There are no active problems to display for this patient.   Past Surgical History:  Procedure Laterality Date  . REPLACEMENT OF URETER     Ureters reimplanted in bladder  . WISDOM TOOTH EXTRACTION  2004    OB History   No obstetric history on file.      Home Medications    Prior to Admission medications   Medication Sig Start Date End Date Taking? Authorizing Provider  acyclovir (ZOVIRAX) 400 MG tablet Take 1 tablet (400 mg total) by mouth 2 (two) times daily. 06/28/18   Robyn Haber, MD  Aspirin-Acetaminophen-Caffeine (GOODY HEADACHE PO) Take by mouth.    [provider]  Azelastine-Fluticasone (DYMISTA NA) Place into the nose.    [provider]  cetirizine (ZYRTEC) 10 MG tablet Take 10 mg by mouth 2 (two) times daily.    [provider]  cyclobenzaprine (FLEXERIL) 5 MG tablet  10/29/18   [provider]  DiazePAM (VALIUM PO) Take by mouth.    [provider]  EPINEPHrine 0.3 mg/0.3 mL IJ SOAJ injection INJECT INTO THE MUSCLE ONCE FOR 1 DOSE 09/20/18   [provider]  ibuprofen (ADVIL) 800 MG tablet Take 1 tablet (800 mg total) by mouth every 8 (eight)  hours as needed. 08/31/19   Sharion Balloon, NP  ketotifen (ALAWAY) 0.025 % ophthalmic solution 1 drop 2 (two) times daily.    [provider]  levothyroxine (SYNTHROID, LEVOTHROID) 88 MCG tablet 1 tablet from Monday to Saturday. Take only 2 tablet every Sunday. 06/26/18   [provider]  LO LOESTRIN FE 1 MG-10 MCG / 10 MCG tablet TK 1 T PO QD 07/12/18   [provider]  montelukast (SINGULAIR) 10 MG tablet TAKE 1 TABLET(10 MG) BY MOUTH AT BEDTIME 03/06/19   Valentina Shaggy, MD  Probiotic Product (PROBIOTIC DAILY PO) Take by mouth.    [provider]  ranitidine (ZANTAC) 150 MG tablet Take 150 mg by mouth 2 (two) times daily.    [provider]  sertraline (ZOLOFT) 50 MG tablet 100 mg.  10/29/18   [provider]  VENTOLIN HFA 108 (90 Base) MCG/ACT inhaler INHALE 2 PUFFS QID PRN 06/08/18   [provider]    Family History Family History  Problem Relation Age of Onset  . Asthma Mother   . Urticaria Mother   . Allergic rhinitis Mother   . Asthma Brother   . Eczema Brother   . Allergic rhinitis Brother   .  Osteoarthritis Father     Social History Social History   Tobacco Use  . Smoking status: Never Smoker  . Smokeless tobacco: Never Used  Substance Use Topics  . Alcohol use: No    Frequency: Never  . Drug use: No     Allergies   Amoxicillin, Ceftin [cefuroxime axetil], and Penicillins   Review of Systems Review of Systems  Constitutional: Negative for chills and fever.  HENT: Negative for ear pain and sore throat.   Eyes: Negative for pain and visual disturbance.  Respiratory: Negative for cough and shortness of breath.   Cardiovascular: Negative for chest pain and palpitations.  Gastrointestinal: Negative for abdominal pain and vomiting.  Genitourinary: Negative for dysuria and hematuria.  Musculoskeletal: Positive for arthralgias. Negative for back pain.  Skin: Negative for color change and rash.   Neurological: Negative for seizures and syncope.  All other systems reviewed and are negative.    Physical Exam Triage Vital Signs ED Triage Vitals  Enc Vitals Group     BP 08/31/19 1639 123/71     Pulse Rate 08/31/19 1639 72     Resp --      Temp 08/31/19 1639 98.6 F (37 C)     Temp Source 08/31/19 1639 Oral     SpO2 08/31/19 1639 100 %     Weight 08/31/19 1643 203 lb (92.1 kg)     Height --      Head Circumference --      Peak Flow --      Pain Score 08/31/19 1643 5     Pain Loc --      Pain Edu? --      Excl. in GC? --    No data found.  Updated Vital Signs BP 123/71 (BP Location: Left Arm)   Pulse 72   Temp 98.6 F (37 C) (Oral)   Wt 203 lb (92.1 kg)   SpO2 100%   BMI 37.74 kg/m   Visual Acuity Right Eye Distance:   Left Eye Distance:   Bilateral Distance:    Right Eye Near:   Left Eye Near:    Bilateral Near:     Physical Exam Vitals signs and nursing note reviewed.  Constitutional:      General: She is not in acute distress.    Appearance: She is well-developed.  HENT:     Head: Normocephalic and atraumatic.  Eyes:     Conjunctiva/sclera: Conjunctivae normal.  Neck:     Musculoskeletal: Neck supple.  Cardiovascular:     Rate and Rhythm: Normal rate and regular rhythm.     Heart sounds: No murmur.  Pulmonary:     Effort: Pulmonary effort is normal. No respiratory distress.     Breath sounds: Normal breath sounds.  Abdominal:     Palpations: Abdomen is soft.     Tenderness: There is no abdominal tenderness. There is no guarding or rebound.  Musculoskeletal:        General: Swelling and tenderness present.       Feet:  Skin:    General: Skin is warm and dry.     Capillary Refill: Capillary refill takes less than 2 seconds.     Findings: No bruising, erythema or lesion.  Neurological:     General: No focal deficit present.     Mental Status: She is alert and oriented to person, place, and time.     Sensory: No sensory deficit.      Motor: No weakness.  UC Treatments / Results  Labs (all labs ordered are listed, but only abnormal results are displayed) Labs Reviewed - No data to display  EKG   Radiology No results found.  Procedures Procedures (including critical care time)  Medications Ordered in UC Medications - No data to display  Initial Impression / Assessment and Plan / UC Course  I have reviewed the triage vital signs and the nursing notes.  Pertinent labs & imaging results that were available during my care of the patient were reviewed by me and considered in my medical decision making (see chart for details).   Left ankle pain.  Xray negative. Treating with ibuprofen, RICE, ace wrap.  Instructed patient to follow-up with her PCP or an orthopedist if her symptoms continue or worsen or she develops new symptoms such as paresthesias, numbness, weakness.  Patient agrees to plan of care.     Final Clinical Impressions(s) / UC Diagnoses   Final diagnoses:  Acute left ankle pain     Discharge Instructions     Take ibuprofen as prescribed.  Rest and elevate your hand.  Apply ice packs 2-3 times a day for up to 20 minutes each.  Wear the Ace wrap as needed for comfort.    Follow up with your primary care provider or an orthopedist if you symptoms continue or worsen;  Or if you develop new symptoms, such as numbness, tingling, or weakness.       ED Prescriptions    Medication Sig Dispense Auth. Provider   ibuprofen (ADVIL) 800 MG tablet Take 1 tablet (800 mg total) by mouth every 8 (eight) hours as needed. 21 tablet Mickie Bailate, Christean Silvestri H, NP     I have reviewed the PDMP during this encounter.   Mickie Bailate, Morrill Bomkamp H, NP 08/31/19 1815

## 2019-08-31 NOTE — Discharge Instructions (Addendum)
Take ibuprofen as prescribed.  Rest and elevate your hand.  Apply ice packs 2-3 times a day for up to 20 minutes each.  Wear the Ace wrap as needed for comfort.    Follow up with your primary care provider or an orthopedist if you symptoms continue or worsen;  Or if you develop new symptoms, such as numbness, tingling, or weakness.

## 2019-08-31 NOTE — ED Triage Notes (Addendum)
Pt. States she was at work and tripped over the power cable in the floor and as she down she twisted left ankle, this happened today. She states it tingles and has a sharp pain when walking, no movement is when it throbs.

## 2019-09-03 ENCOUNTER — Ambulatory Visit (INDEPENDENT_AMBULATORY_CARE_PROVIDER_SITE_OTHER): Payer: BC Managed Care – PPO

## 2019-09-03 DIAGNOSIS — J309 Allergic rhinitis, unspecified: Secondary | ICD-10-CM

## 2019-09-05 ENCOUNTER — Ambulatory Visit: Payer: PRIVATE HEALTH INSURANCE | Admitting: Allergy & Immunology

## 2019-09-12 ENCOUNTER — Ambulatory Visit (INDEPENDENT_AMBULATORY_CARE_PROVIDER_SITE_OTHER): Payer: BC Managed Care – PPO

## 2019-09-12 DIAGNOSIS — J309 Allergic rhinitis, unspecified: Secondary | ICD-10-CM

## 2019-09-16 DIAGNOSIS — J4530 Mild persistent asthma, uncomplicated: Secondary | ICD-10-CM | POA: Diagnosis not present

## 2019-09-16 DIAGNOSIS — L501 Idiopathic urticaria: Secondary | ICD-10-CM | POA: Diagnosis not present

## 2019-09-16 DIAGNOSIS — J3089 Other allergic rhinitis: Secondary | ICD-10-CM | POA: Diagnosis not present

## 2019-09-16 DIAGNOSIS — H1010 Acute atopic conjunctivitis, unspecified eye: Secondary | ICD-10-CM | POA: Diagnosis not present

## 2019-09-17 ENCOUNTER — Ambulatory Visit: Payer: Self-pay

## 2019-09-17 ENCOUNTER — Ambulatory Visit (INDEPENDENT_AMBULATORY_CARE_PROVIDER_SITE_OTHER): Payer: BC Managed Care – PPO | Admitting: Family Medicine

## 2019-09-17 ENCOUNTER — Other Ambulatory Visit: Payer: Self-pay

## 2019-09-17 ENCOUNTER — Encounter: Payer: Self-pay | Admitting: Family Medicine

## 2019-09-17 VITALS — BP 128/88 | HR 86 | Temp 98.7°F | Resp 18 | Ht 64.2 in | Wt 209.8 lb

## 2019-09-17 DIAGNOSIS — J4520 Mild intermittent asthma, uncomplicated: Secondary | ICD-10-CM | POA: Diagnosis not present

## 2019-09-17 DIAGNOSIS — L501 Idiopathic urticaria: Secondary | ICD-10-CM | POA: Diagnosis not present

## 2019-09-17 DIAGNOSIS — H1010 Acute atopic conjunctivitis, unspecified eye: Secondary | ICD-10-CM | POA: Insufficient documentation

## 2019-09-17 DIAGNOSIS — J3089 Other allergic rhinitis: Secondary | ICD-10-CM

## 2019-09-17 DIAGNOSIS — J302 Other seasonal allergic rhinitis: Secondary | ICD-10-CM

## 2019-09-17 DIAGNOSIS — J4530 Mild persistent asthma, uncomplicated: Secondary | ICD-10-CM | POA: Diagnosis not present

## 2019-09-17 MED ORDER — FAMOTIDINE 20 MG PO TABS: ORAL_TABLET | ORAL | 5 refills | Status: DC

## 2019-09-17 MED ORDER — CETIRIZINE HCL 10 MG PO TABS: ORAL_TABLET | ORAL | 5 refills | Status: DC

## 2019-09-17 MED ORDER — EPINEPHRINE 0.3 MG/0.3ML IJ SOAJ: INTRAMUSCULAR | 2 refills | Status: DC

## 2019-09-17 MED ORDER — FLOVENT HFA 110 MCG/ACT IN AERO: 2.0000 | INHALATION_SPRAY | Freq: Two times a day (BID) | RESPIRATORY_TRACT | 5 refills | Status: DC

## 2019-09-17 MED ORDER — MONTELUKAST SODIUM 10 MG PO TABS: ORAL_TABLET | ORAL | 5 refills | Status: DC

## 2019-09-17 MED ORDER — ALBUTEROL SULFATE HFA 108 (90 BASE) MCG/ACT IN AERS: 2.0000 | INHALATION_SPRAY | RESPIRATORY_TRACT | 3 refills | Status: DC | PRN

## 2019-09-17 NOTE — Patient Instructions (Addendum)
Asthma Continue montelukast 10 mg once a day to prevent cough or wheeze Continue albuterol 2 puffs every 4 hours as needed for cough or wheeze You may use albuterol 2 puffs 5-15 minutes before activity to decrease cough or wheeze For asthma flares, begin Flovent 110-2 puffs twice a day with a spacer for 2 weeks or until cough and wheeze free  Allergic rhinitis Continue Flonase 1 spray in each nostril twice a day and Astelin 1-2 sprays in each nostril twice a day combination twice a day as needed for nasal symptoms Consider saline nasal rinses as needed for nasal symptoms. Use this before any medicated nasal sprays for best result Continue montelukast (as above) For thick post nasal drainage, begin Mucinex [phone] mg twice a day and increase hydration as tolerates Continue allergen immunotherapy and have access to epniphrine  Allergic conjunctivitis Continue Zaditor eye drops one drop in each eye twice a day as needed  Chronic urticaria Continue Xolair 300 mg injections once every 4 weeks. We will submit to your insurance to increase to every 2 weeks for better relief of symptoms Continue H1H2 blockade at the lowest dose at which your symptoms are well controlled- listed below  . Cetirizine (Zyrtec) 10mg  twice a day and famotidine (Pepcid) 20 mg twice a day. If no symptoms for 7-14 days then decrease to. . Cetirizine (Zyrtec) 10mg  twice a day and famotidine (Pepcid) 20 mg once a day.  If no symptoms for 7-14 days then decrease to. . Cetirizine (Zyrtec) 10mg  twice a day.  If no symptoms for 7-14 days then decrease to. . Cetirizine (Zyrtec) 10mg  once a day. .        If symptoms return, then step up dosage  Keep a detailed symptom journal including foods eaten, contact with allergens, medications taken, weather changes.   Call the clinic if this treatment plan is not working well for you  Follow up in 3 months or sooner if needed.  Reducing Pollen Exposure The American Academy of  Allergy, Asthma and Immunology suggests the following steps to reduce your exposure to pollen during allergy seasons. 1. Do not hang sheets or clothing out to dry; pollen may collect on these items. 2. Do not mow lawns or spend time around freshly cut grass; mowing stirs up pollen. 3. Keep windows closed at night.  Keep car windows closed while driving. 4. Minimize morning activities outdoors, a time when pollen counts are usually at their highest. 5. Stay indoors as much as possible when pollen counts or humidity is high and on windy days when pollen tends to remain in the air longer. 6. Use air conditioning when possible.  Many air conditioners have filters that trap the pollen spores. 7. Use a HEPA room air filter to remove pollen form the indoor air you breathe. 8.  Control of Dog or Cat Allergen Avoidance is the best way to manage a dog or cat allergy. If you have a dog or cat and are allergic to dog or cats, consider removing the dog or cat from the home. If you have a dog or cat but don't want to find it a new home, or if your family wants a pet even though someone in the household is allergic, here are some strategies that may help keep symptoms at bay:  9. Keep the pet out of your bedroom and restrict it to only a few rooms. Be advised that keeping the dog or cat in only one room will not limit the allergens  to that room. 10. Don't pet, hug or kiss the dog or cat; if you do, wash your hands with soap and water. 11. High-efficiency particulate air (HEPA) cleaners run continuously in a bedroom or living room can reduce allergen levels over time. 12. Regular use of a high-efficiency vacuum cleaner or a central vacuum can reduce allergen levels. 13. Giving your dog or cat a bath at least once a week can reduce airborne allergen.  Control of Mold Allergen Mold and fungi can grow on a variety of surfaces provided certain temperature and moisture conditions exist.  Outdoor molds grow on plants,  decaying vegetation and soil.  The major outdoor mold, Alternaria and Cladosporium, are found in very high numbers during hot and dry conditions.  Generally, a late Summer - Fall peak is seen for common outdoor fungal spores.  Rain will temporarily lower outdoor mold spore count, but counts rise rapidly when the rainy period ends.  The most important indoor molds are Aspergillus and Penicillium.  Dark, humid and poorly ventilated basements are ideal sites for mold growth.  The next most common sites of mold growth are the bathroom and the kitchen.  Outdoor [employer] 14. Use air conditioning and keep windows closed 15. Avoid exposure to decaying vegetation. 16. Avoid leaf raking. 17. Avoid grain handling. 18. Consider wearing a face mask if working in moldy areas.  Indoor Mold Control 1. Maintain humidity below 50%. 2. Clean washable surfaces with 5% bleach solution. 3. Remove sources e.g. Contaminated carpets.  Control of House Dust Mite Allergen House dust mites play a major role in allergic asthma and rhinitis.  They occur in environments with high humidity wherever human skin, the food for dust mites is found. High levels have been detected in dust obtained from mattresses, pillows, carpets, upholstered furniture, bed covers, clothes and soft toys.  The principal allergen of the house dust mite is found in its feces.  A gram of dust may contain 1,000 mites and 250,000 fecal particles.  Mite antigen is easily measured in the air during house cleaning activities.    1. Encase mattresses, including the box spring, and pillow, in an air tight cover.  Seal the zipper end of the encased mattresses with wide adhesive tape. 2. Wash the bedding in water of 130 degrees Farenheit weekly.  Avoid cotton comforters/quilts and flannel bedding: the most ideal bed covering is the dacron comforter. 3. Remove all upholstered furniture from the bedroom. 4. Remove carpets, carpet padding, rugs, and  non-washable window drapes from the bedroom.  Wash drapes weekly or use plastic window coverings. 5. Remove all non-washable stuffed toys from the bedroom.  Wash stuffed toys weekly. 6. Have the room cleaned frequently with a vacuum cleaner and a damp dust-mop.  The patient should not be in a room which is being cleaned and should wait 1 hour after cleaning before going into the room. 7. Close and seal all heating outlets in the bedroom.  Otherwise, the room will become filled with dust-laden air.  An electric heater can be used to heat the room. 8. Reduce indoor humidity to less than 50%.  Do not use a humidifier.

## 2019-09-17 NOTE — Progress Notes (Signed)
[address] [place] [address] Dept: [phone]  FOLLOW UP NOTE  Patient ID: Jennifer [name], female    DOB: [birth year]  Age: 33 y.o. MRN: [id number] Date of Office Visit: 09/17/2019  Assessment  Chief Complaint: Allergies (Has been having a flare (SOB, itchy throat, sneezing) the past few weeks. ) and Asthma (Intermittent SOB)  HPI Jennifer [name] is a 33 year old female who presents to the clinic for a follow up visit. She was last seen in this clinic on 03/05/2019 by Dr. Dellis Anes for evaluation of asthma, allergic rhinitis, and chronic idiopathic urticaria. At today's visit, she reports that for the last 1-2 weeks she has been experiencing some shortness of breath with activity and over the last 3 days she has developed a dry cough which she thinks is relate to post nasal drainage. She denies wheeze with activity and rest. She does report chest tightness which occurred 2 mornings ago and resolved with no intervention. She is currently taking montelukast 10 mg once a day and has used her albuterol 1 time over the last year, however, she has not used her albuterol lately. She reports she is "just too lazy to use the albuterol". She does carry the albuterol with her at all times. Allergic rhinitis is reported as not well controlled over the last few days with clear rhinorrhea and sneezing in the morning and an itch in her throat throughout the day over the last few days. She continues montelukast as above and cetirizine and famotidine daily. She reports using Flonase and Astelin as needed. She is not currently using a nasal rinse, however, she occasionally uses nasal saline spray. She reports allergen immunotherapy has significantly reduced her symptoms of allergic rhinitis. Chronic urticaria is reported as not well controlled with hives and itch occurring mostly on her legs and feet which occurs about 3-4 weeks after receiving her Xolair injections. She reports a significant  improvement with Xolair, however, has not yet experienced complete remission. Her current medications are listed in the chart.    Drug Allergies:  Allergies  Allergen Reactions   Amoxicillin    Ceftin [Cefuroxime Axetil]    Penicillins     Physical Exam: BP 128/88 (BP Location: Left Arm, Patient Position: Sitting, Cuff Size: Normal)    Pulse 86    Temp 98.7 F (37.1 C) (Temporal)    Resp 18    Ht 5' 4.2" (1.631 m)    Wt 209 lb 12.8 oz (95.2 kg)    SpO2 99%    BMI 35.79 kg/m    Physical Exam Vitals signs reviewed.  Constitutional:      Appearance: Normal appearance.  HENT:     Head: Normocephalic and atraumatic.     Right Ear: Tympanic membrane normal.     Left Ear: Tympanic membrane normal.     Nose:     Comments: Bilateral nares slightly erythematous with clear nasal drainage noted. Pharynx normal. Ears normal. Eyes normal.    Mouth/Throat:     Pharynx: Oropharynx is clear.  Eyes:     Conjunctiva/sclera: Conjunctivae normal.  Neck:     Musculoskeletal: Normal range of motion and neck supple.  Cardiovascular:     Rate and Rhythm: Normal rate and regular rhythm.     Heart sounds: Normal heart sounds. No murmur.  Pulmonary:     Effort: Pulmonary effort is normal.     Breath sounds: Normal breath sounds.     Comments: Lungs clear to auscultation  Musculoskeletal: Normal range of motion.  Skin:    General: Skin is warm and dry.     Comments: No rash noted today  Neurological:     Mental Status: She is alert and oriented to person, place, and time.  Psychiatric:        Mood and Affect: Mood normal.        Behavior: Behavior normal.        Thought Content: Thought content normal.        Judgment: Judgment normal.     Diagnostics: FVC 3.03, FEV1 2.86. Predicted FVC 3.71, predicted FEV1 2.92. Spirometry indicates normal ventilatory function.   Assessment and Plan: 1. Mild persistent asthma without complication   2. Seasonal and perennial allergic rhinitis   3.  Chronic idiopathic urticaria   4. Seasonal allergic conjunctivitis     Meds ordered this encounter  Medications   montelukast (SINGULAIR) 10 MG tablet    Sig: TAKE 1 TABLET(10 MG) BY MOUTH AT BEDTIME    Dispense:  30 tablet    Refill:  5   fluticasone (FLOVENT HFA) 110 MCG/ACT inhaler    Sig: Inhale 2 puffs into the lungs 2 (two) times daily. For 2 weeks during asthma flare.    Dispense:  1 Inhaler    Refill:  5    Hold please. Patient will call when needed.   albuterol (VENTOLIN HFA) 108 (90 Base) MCG/ACT inhaler    Sig: Inhale 2 puffs into the lungs every 4 (four) hours as needed for wheezing or shortness of breath.    Dispense:  18 g    Refill:  3   famotidine (PEPCID) 20 MG tablet    Sig: Take one tablet 1-2 times daily as needed.    Dispense:  60 tablet    Refill:  5   cetirizine (ZYRTEC) 10 MG tablet    Sig: Take 1 tablet 1-2 times daily as needed.    Dispense:  60 tablet    Refill:  5   EPINEPHrine 0.3 mg/0.3 mL IJ SOAJ injection    Sig: INJECT INTO THE MUSCLE ONCE FOR 1 DOSE    Dispense:  1 each    Refill:  2    Patient Instructions  Asthma Continue montelukast 10 mg once a day to prevent cough or wheeze Continue albuterol 2 puffs every 4 hours as needed for cough or wheeze You may use albuterol 2 puffs 5-15 minutes before activity to decrease cough or wheeze For asthma flares, begin Flovent 110-2 puffs twice a day with a spacer for 2 weeks or until cough and wheeze free  Allergic rhinitis Continue Flonase 1 spray in each nostril twice a day and Astelin 1-2 sprays in each nostril twice a day combination twice a day as needed for nasal symptoms Consider saline nasal rinses as needed for nasal symptoms. Use this before any medicated nasal sprays for best result Continue montelukast (as above) For thick post nasal drainage, begin Mucinex [phone] mg twice a day and increase hydration as tolerates Continue allergen immunotherapy and have access to  epinephrine  Allergic conjunctivitis Continue Zaditor eye drops one drop in each eye twice a day as needed  Chronic urticaria Continue Xolair 300 mg injections once every 4 weeks. We will submit to your insurance to increase to every 2 weeks for better relief of symptoms Continue H1H2 blockade at the lowest dose at which your symptoms are well controlled- listed below   Cetirizine (Zyrtec)  twice a day and famotidine (Pepcid)  20 mg twice a day. If no symptoms for 7-14 days then decrease to  Cetirizine (Zyrtec) 10mg  twice a day and famotidine (Pepcid) 20 mg once a day.  If no symptoms for 7-14 days then decrease to  Cetirizine (Zyrtec) 10mg  twice a day.  If no symptoms for 7-14 days then decrease to  Cetirizine (Zyrtec) 10mg  once a day.        If symptoms return, then step up dosage  Keep a detailed symptom journal including foods eaten, contact with allergens, medications taken, weather changes.   Call the clinic if this treatment plan is not working well for you  Follow up in 3 months or sooner if needed.  Return in about 3 months (around 12/18/2019), or if symptoms worsen or fail to improve.    Thank you for the opportunity to care for this patient.  Please do not hesitate to contact me with questions.  Gareth Morgan, FNP [facility]

## 2019-10-10 ENCOUNTER — Ambulatory Visit (INDEPENDENT_AMBULATORY_CARE_PROVIDER_SITE_OTHER): Payer: BC Managed Care – PPO

## 2019-10-10 DIAGNOSIS — J309 Allergic rhinitis, unspecified: Secondary | ICD-10-CM

## 2019-10-21 DIAGNOSIS — L501 Idiopathic urticaria: Secondary | ICD-10-CM | POA: Diagnosis not present

## 2019-10-22 ENCOUNTER — Other Ambulatory Visit: Payer: Self-pay

## 2019-10-22 ENCOUNTER — Ambulatory Visit (INDEPENDENT_AMBULATORY_CARE_PROVIDER_SITE_OTHER): Payer: BC Managed Care – PPO

## 2019-10-22 DIAGNOSIS — L501 Idiopathic urticaria: Secondary | ICD-10-CM | POA: Diagnosis not present

## 2019-10-24 ENCOUNTER — Ambulatory Visit (INDEPENDENT_AMBULATORY_CARE_PROVIDER_SITE_OTHER): Payer: BC Managed Care – PPO

## 2019-10-24 DIAGNOSIS — J309 Allergic rhinitis, unspecified: Secondary | ICD-10-CM | POA: Diagnosis not present

## 2019-10-31 ENCOUNTER — Ambulatory Visit (INDEPENDENT_AMBULATORY_CARE_PROVIDER_SITE_OTHER): Payer: BC Managed Care – PPO

## 2019-10-31 DIAGNOSIS — J309 Allergic rhinitis, unspecified: Secondary | ICD-10-CM | POA: Diagnosis not present

## 2019-11-12 ENCOUNTER — Ambulatory Visit (INDEPENDENT_AMBULATORY_CARE_PROVIDER_SITE_OTHER): Payer: BC Managed Care – PPO

## 2019-11-12 DIAGNOSIS — J309 Allergic rhinitis, unspecified: Secondary | ICD-10-CM

## 2019-11-18 DIAGNOSIS — L501 Idiopathic urticaria: Secondary | ICD-10-CM | POA: Diagnosis not present

## 2019-11-19 ENCOUNTER — Ambulatory Visit (INDEPENDENT_AMBULATORY_CARE_PROVIDER_SITE_OTHER): Payer: BC Managed Care – PPO

## 2019-11-19 DIAGNOSIS — L501 Idiopathic urticaria: Secondary | ICD-10-CM

## 2019-11-26 ENCOUNTER — Ambulatory Visit: Payer: BC Managed Care – PPO | Admitting: Allergy & Immunology

## 2019-11-26 ENCOUNTER — Encounter: Payer: Self-pay | Admitting: Allergy & Immunology

## 2019-11-26 ENCOUNTER — Ambulatory Visit: Payer: Self-pay

## 2019-11-26 ENCOUNTER — Other Ambulatory Visit: Payer: Self-pay

## 2019-11-26 VITALS — BP 124/76 | HR 84 | Temp 98.6°F | Resp 18

## 2019-11-26 DIAGNOSIS — T7800XD Anaphylactic reaction due to unspecified food, subsequent encounter: Secondary | ICD-10-CM | POA: Diagnosis not present

## 2019-11-26 DIAGNOSIS — J4530 Mild persistent asthma, uncomplicated: Secondary | ICD-10-CM | POA: Diagnosis not present

## 2019-11-26 DIAGNOSIS — J302 Other seasonal allergic rhinitis: Secondary | ICD-10-CM

## 2019-11-26 DIAGNOSIS — E039 Hypothyroidism, unspecified: Secondary | ICD-10-CM | POA: Diagnosis not present

## 2019-11-26 DIAGNOSIS — J309 Allergic rhinitis, unspecified: Secondary | ICD-10-CM

## 2019-11-26 DIAGNOSIS — L501 Idiopathic urticaria: Secondary | ICD-10-CM | POA: Diagnosis not present

## 2019-11-26 DIAGNOSIS — J3089 Other allergic rhinitis: Secondary | ICD-10-CM | POA: Diagnosis not present

## 2019-11-26 NOTE — Progress Notes (Signed)
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 Allergic urticaria - well controlled - We will increase to Xolair every two weeks. - We can do the Xolair and allergy shots on the same day.  - Get pork out of your diet to see if this helps. - We can space out the Xolair again in a few months. - Continue with cetirizine two tablets twice daily. - Continue with Singulair daily.   2. Allergic rhinitis/allergic conjunctivitis - Continue with Dymista 1-2 sprays per nostril up to twice daily as needed.  - Continue with Zatidor eye drops as needed.  - Continue with allergy shots. - We are going to increase the cat and dog dander in your next set of allergy vials.   3. Mild intermittent asthma  - We did not bother with a breathing test today. - Continue with Singulair 10mg  daily.  - Continue Proventil 2 puffs every 4 hours as needed for cough or wheeze  4. Return in about 6 months (around 05/25/2020). This can be an in-person, a virtual Webex or a telephone follow up visit.  Subjective:   Jennifer [name] [name] a 34 y.o. female presenting today for follow up of  Chief Complaint  Patient presents with  . Asthma  . Allergic Rhinitis     Jennifer [name] has a history of the following: Patient Active Problem List   Diagnosis Date Noted  . Mild persistent asthma without complication 09/17/2019  . Seasonal and perennial allergic rhinitis 09/17/2019  . Chronic idiopathic urticaria 09/17/2019  . Seasonal allergic conjunctivitis 09/17/2019    History obtained from: chart review and patient.  Jennifer [name] a 34 y.o.  female presenting for a follow up visit.  She was last seen by our nurse practitioner in October 2020.  At that time, she was continued on montelukast daily as well as albuterol and Flovent as needed.  For her allergic rhinitis, we continued Flonase and Astelin as needed as well as allergen immunotherapy.  Zaditor eyedrops were continued for her allergic conjunctivitis.  Her chronic urticaria was under good control with the antihistamine regimen in combination with Xolair.  Since last visit, she has mostly done well. [name] now getting a full time job back.  She has been lasting the last 6 months or so with a couple of part-time jobs.  This has been difficult, as she has needed to pay for her insurance out-of-pocket.  [name] feeling better about having a full-time job and full-time benefits.  Asthma/Respiratory Symptom History: She has not been using her albuterol inhaler at all.  She does have an inhaled steroid to use as needed, but does not remember the last time that she needed this.  She has not required prednisone. ACT score [name] 22, indicating excellent asthma control.  Allergic Rhinitis Symptom History: She remains on her allergen immunotherapy.  [name] using her nasal sprays on a as needed basis.  [name] taking her antihistamine and montelukast every day.. She has a dog for one year and has had a cat for 7 years.   Jennifer [name] on allergen immunotherapy. She receives two injections. Immunotherapy script #1 contains  trees, weeds, grasses, cat and dog. She currently receives 0.73mL of the RED vial (1/100). Immunotherapy script #2 contains ragweed, molds and dust mites. She currently receives 0.14mL of the RED vial (1/100). She started shots November of 2019 and reached maintenance in June of 2020.  Urticaria Symptom History: She remains on Xolair every 4 weeks. She started having breakthrough hives 2-3 months ago. She was stressed over the summer but did not really have issues. She does eat a lot of bacon,  but does not eat lamb. Alpha gal IgE was negative.  But her IgE to lamb and pork was slightly positive.  She never did try taking this out of her diet to see if it made a difference to her hives, but [name] open to doing so.  Otherwise, there have been no changes to her past medical history, surgical history, family history, or social history.    Review of Systems  Constitutional: Negative.  Negative for chills, fever, malaise/fatigue and weight loss.  HENT: Negative.  Negative for congestion, ear discharge, ear pain and sore throat.   Eyes: Negative for pain, discharge and redness.  Respiratory: Positive for cough. Negative for sputum production, shortness of breath and wheezing.   Cardiovascular: Negative.  Negative for chest pain and palpitations.  Gastrointestinal: Negative for abdominal pain, constipation, diarrhea, heartburn, nausea and vomiting.  Skin: Positive for itching and rash.  Neurological: Negative for dizziness and headaches.  Endo/Heme/Allergies: Negative for environmental allergies. Does not bruise/bleed easily.       Objective:   Blood pressure 124/76, pulse 84, temperature 98.6 F (37 C), temperature source Temporal, resp. rate 18, SpO2 100 %. There [name] no height or weight on file to calculate BMI.   Physical Exam:  Physical Exam  Constitutional: She appears well-developed.  Very pleasant female. Cooperative with the exam.   HENT:  Head: Normocephalic and atraumatic.  Right Ear: Tympanic membrane, external ear and ear canal normal.  Left Ear: Tympanic membrane, external ear and ear canal normal.  Nose: Mucosal edema and rhinorrhea present. No nasal deformity or septal deviation. No epistaxis. Right sinus exhibits no maxillary sinus tenderness and no frontal sinus tenderness. Left sinus exhibits no maxillary sinus tenderness and no frontal sinus tenderness.  Mouth/Throat: Uvula [name] midline and oropharynx [name] clear and moist. Mucous membranes are not pale and not dry.    Cobblestoning present in the posterior oropharynx.  Eyes: Pupils are equal, round, and reactive to light. Conjunctivae and EOM are normal. Right eye exhibits no chemosis and no discharge. Left eye exhibits no chemosis and no discharge. Right conjunctiva [name] not injected. Left conjunctiva [name] not injected.  Cardiovascular: Normal rate, regular rhythm and normal heart sounds.  Respiratory: Effort normal and breath sounds normal. No accessory muscle usage. No tachypnea. No respiratory distress. She has no wheezes. She has no rhonchi. She has no rales. She exhibits no tenderness.  Moving air well in all lung fields.  No increased work of breathing.  Lymphadenopathy:    She has no cervical adenopathy.  Neurological: [name] alert.  Skin: No abrasion, no petechiae and no rash noted. Rash [name] not papular, not vesicular and not urticarial. No erythema. No pallor.  Psychiatric: She has a normal mood and affect.     Diagnostic studies: none       Salvatore Marvel, MD  [facility]

## 2019-11-26 NOTE — Patient Instructions (Addendum)
1. Allergic urticaria - well controlled - We will increase to Xolair every two weeks. - We can do the Xolair and allergy shots on the same day.  - Get pork out of your diet to see if this helps. - We can space out the Xolair again in a few months. - Continue with cetirizine two tablets twice daily. - Continue with Singulair daily.   2. Allergic rhinitis/allergic conjunctivitis - Continue with Dymista 1-2 sprays per nostril up to twice daily as needed.  - Continue with Zatidor eye drops as needed.  - Continue with allergy shots. - We are going to increase the cat and dog dander in your next set of allergy vials.   3. Mild intermittent asthma  - We did not bother with a breathing test today. - Continue with Singulair 10mg  daily.  - Continue Proventil 2 puffs every 4 hours as needed for cough or wheeze  4. Return in about 6 months (around 05/25/2020). This can be an in-person, a virtual Webex or a telephone follow up visit.   Please inform 07/26/2020 of any Emergency Department visits, hospitalizations, or changes in symptoms. Call us before going to the ED for breathing or allergy symptoms since we might be able to fit you in for a sick visit. Feel free to contact us anytime with any questions, problems, or concerns.  It was a pleasure to see you again today!  Websites that have reliable patient information: 1. American Academy of Asthma, Allergy, and Immunology: www.aaaai.org 2. Food Allergy Research and Education (FARE): foodallergy.org 3. Mothers of Asthmatics: http://www.asthmacommunitynetwork.org 4. American College of Allergy, Asthma, and Immunology: www.acaai.org  "Like" [place] on Facebook and Instagram for our latest updates!        Make sure you are registered to vote! If you have moved or changed any of your contact information, you will need to get this updated before voting!  In some cases, you MAY be able to register to vote online:  [place]

## 2019-12-01 DIAGNOSIS — Z124 Encounter for screening for malignant neoplasm of cervix: Secondary | ICD-10-CM | POA: Diagnosis not present

## 2019-12-01 DIAGNOSIS — Z01419 Encounter for gynecological examination (general) (routine) without abnormal findings: Secondary | ICD-10-CM | POA: Diagnosis not present

## 2019-12-01 DIAGNOSIS — Z6838 Body mass index (BMI) 38.0-38.9, adult: Secondary | ICD-10-CM | POA: Diagnosis not present

## 2019-12-01 DIAGNOSIS — Z803 Family history of malignant neoplasm of breast: Secondary | ICD-10-CM | POA: Diagnosis not present

## 2019-12-01 DIAGNOSIS — Z3041 Encounter for surveillance of contraceptive pills: Secondary | ICD-10-CM | POA: Diagnosis not present

## 2019-12-01 DIAGNOSIS — Z1151 Encounter for screening for human papillomavirus (HPV): Secondary | ICD-10-CM | POA: Diagnosis not present

## 2019-12-03 ENCOUNTER — Ambulatory Visit (INDEPENDENT_AMBULATORY_CARE_PROVIDER_SITE_OTHER): Payer: BC Managed Care – PPO

## 2019-12-03 DIAGNOSIS — L501 Idiopathic urticaria: Secondary | ICD-10-CM | POA: Diagnosis not present

## 2019-12-10 ENCOUNTER — Ambulatory Visit (INDEPENDENT_AMBULATORY_CARE_PROVIDER_SITE_OTHER): Payer: BC Managed Care – PPO

## 2019-12-10 DIAGNOSIS — J309 Allergic rhinitis, unspecified: Secondary | ICD-10-CM | POA: Diagnosis not present

## 2019-12-16 DIAGNOSIS — R87610 Atypical squamous cells of undetermined significance on cytologic smear of cervix (ASC-US): Secondary | ICD-10-CM | POA: Diagnosis not present

## 2019-12-16 DIAGNOSIS — R87810 Cervical high risk human papillomavirus (HPV) DNA test positive: Secondary | ICD-10-CM | POA: Diagnosis not present

## 2019-12-16 DIAGNOSIS — Z3200 Encounter for pregnancy test, result unknown: Secondary | ICD-10-CM | POA: Diagnosis not present

## 2019-12-16 DIAGNOSIS — L501 Idiopathic urticaria: Secondary | ICD-10-CM | POA: Diagnosis not present

## 2019-12-16 DIAGNOSIS — N72 Inflammatory disease of cervix uteri: Secondary | ICD-10-CM | POA: Diagnosis not present

## 2019-12-17 ENCOUNTER — Ambulatory Visit (INDEPENDENT_AMBULATORY_CARE_PROVIDER_SITE_OTHER): Payer: BC Managed Care – PPO

## 2019-12-17 ENCOUNTER — Other Ambulatory Visit: Payer: Self-pay

## 2019-12-17 DIAGNOSIS — J4530 Mild persistent asthma, uncomplicated: Secondary | ICD-10-CM

## 2019-12-17 DIAGNOSIS — J309 Allergic rhinitis, unspecified: Secondary | ICD-10-CM

## 2019-12-17 DIAGNOSIS — L501 Idiopathic urticaria: Secondary | ICD-10-CM

## 2019-12-24 ENCOUNTER — Ambulatory Visit (INDEPENDENT_AMBULATORY_CARE_PROVIDER_SITE_OTHER): Payer: BC Managed Care – PPO

## 2019-12-24 DIAGNOSIS — J309 Allergic rhinitis, unspecified: Secondary | ICD-10-CM

## 2019-12-30 DIAGNOSIS — L501 Idiopathic urticaria: Secondary | ICD-10-CM | POA: Diagnosis not present

## 2019-12-31 ENCOUNTER — Ambulatory Visit (INDEPENDENT_AMBULATORY_CARE_PROVIDER_SITE_OTHER): Payer: BC Managed Care – PPO

## 2019-12-31 DIAGNOSIS — L501 Idiopathic urticaria: Secondary | ICD-10-CM | POA: Diagnosis not present

## 2020-01-01 ENCOUNTER — Telehealth: Payer: Self-pay

## 2020-01-01 MED ORDER — TRIAMCINOLONE ACETONIDE 0.025 % EX OINT: 1.0000 "application " | TOPICAL_OINTMENT | Freq: Two times a day (BID) | CUTANEOUS | 0 refills | Status: DC

## 2020-01-01 NOTE — Telephone Encounter (Signed)
Per Dr. Dellis Anes prescribed Triamcinolone ointment for patient for her allergy injections.

## 2020-01-07 ENCOUNTER — Ambulatory Visit (INDEPENDENT_AMBULATORY_CARE_PROVIDER_SITE_OTHER): Payer: BC Managed Care – PPO

## 2020-01-07 DIAGNOSIS — J309 Allergic rhinitis, unspecified: Secondary | ICD-10-CM | POA: Diagnosis not present

## 2020-01-12 DIAGNOSIS — J301 Allergic rhinitis due to pollen: Secondary | ICD-10-CM | POA: Diagnosis not present

## 2020-01-12 NOTE — Progress Notes (Signed)
Vials exp 01-11-21 

## 2020-01-13 DIAGNOSIS — L501 Idiopathic urticaria: Secondary | ICD-10-CM | POA: Diagnosis not present

## 2020-01-14 ENCOUNTER — Ambulatory Visit (INDEPENDENT_AMBULATORY_CARE_PROVIDER_SITE_OTHER): Payer: BC Managed Care – PPO

## 2020-01-14 ENCOUNTER — Other Ambulatory Visit: Payer: Self-pay

## 2020-01-14 DIAGNOSIS — J4530 Mild persistent asthma, uncomplicated: Secondary | ICD-10-CM

## 2020-01-14 DIAGNOSIS — L501 Idiopathic urticaria: Secondary | ICD-10-CM | POA: Diagnosis not present

## 2020-01-14 DIAGNOSIS — J309 Allergic rhinitis, unspecified: Secondary | ICD-10-CM

## 2020-01-14 MED ORDER — CLOBETASOL PROPIONATE 0.05 % EX OINT: 1.0000 "application " | TOPICAL_OINTMENT | Freq: Two times a day (BID) | CUTANEOUS | 2 refills | Status: DC | PRN

## 2020-01-14 MED ORDER — OMALIZUMAB 150 MG ~~LOC~~ SOLR
300.0000 mg | SUBCUTANEOUS | Status: DC
  Administered 2020-01-14 – 2020-03-10 (×5): 300 mg via SUBCUTANEOUS

## 2020-01-14 NOTE — Progress Notes (Signed)
Patient is having these slight red areas that linger after her injections. Per Dr. Dellis Anes Clobetasol has been sent to pharmacy.

## 2020-01-21 ENCOUNTER — Ambulatory Visit (INDEPENDENT_AMBULATORY_CARE_PROVIDER_SITE_OTHER): Payer: Commercial Managed Care - PPO

## 2020-01-21 DIAGNOSIS — J309 Allergic rhinitis, unspecified: Secondary | ICD-10-CM | POA: Diagnosis not present

## 2020-01-23 ENCOUNTER — Encounter: Payer: Self-pay | Admitting: Allergy & Immunology

## 2020-01-23 ENCOUNTER — Other Ambulatory Visit: Payer: Self-pay

## 2020-01-23 ENCOUNTER — Ambulatory Visit (INDEPENDENT_AMBULATORY_CARE_PROVIDER_SITE_OTHER): Payer: BC Managed Care – PPO | Admitting: Allergy & Immunology

## 2020-01-23 DIAGNOSIS — J3089 Other allergic rhinitis: Secondary | ICD-10-CM | POA: Diagnosis not present

## 2020-01-23 DIAGNOSIS — J4520 Mild intermittent asthma, uncomplicated: Secondary | ICD-10-CM | POA: Diagnosis not present

## 2020-01-23 DIAGNOSIS — L501 Idiopathic urticaria: Secondary | ICD-10-CM

## 2020-01-23 DIAGNOSIS — J302 Other seasonal allergic rhinitis: Secondary | ICD-10-CM

## 2020-01-23 DIAGNOSIS — T7800XD Anaphylactic reaction due to unspecified food, subsequent encounter: Secondary | ICD-10-CM | POA: Diagnosis not present

## 2020-01-23 MED ORDER — PREDNISONE 10 MG PO TABS: ORAL_TABLET | ORAL | 0 refills | Status: DC

## 2020-01-23 NOTE — Progress Notes (Addendum)
RE: Jennifer [name] MRN: [id number] DOB: Sep 12, 1986 Date of Telemedicine Visit: 01/23/2020  Referring provider: Rory Percy, MD Primary care provider: Rory Percy, MD  Chief Complaint: Sinus Problem   Telemedicine Follow Up Visit via Telephone: I connected with Jennifer [name] for a follow up on 01/23/20 by telephone and verified that I am speaking with the correct person using two identifiers.   I discussed the limitations, risks, security and privacy concerns of performing an evaluation and management service by telephone and the availability of in person appointments. I also discussed with the patient that there may be a patient responsible charge related to this service. The patient expressed understanding and agreed to proceed.  Patient is at work.  Provider is at the office.  Visit start time: 9:08 AM Visit end time: 9:21 AM Insurance consent/check in by: [facility] Medical consent and medical assistant/nurse: Olivia Mackie  History of Present Illness:  She is a 34 y.o. female, who is being followed for mild intermittent asthma as well as urticaria and seasonal and perennial allergic rhinitis and food allergies. Her previous allergy office visit was in January 2021 with myself.  At the last visit, we increased her Xolair to every 2 weeks to help provide her with better control of her urticaria.  We recommended keeping pork out of her diet to see if that helped.  We continued with cetirizine 2 tablets twice daily as well as Singulair 10 mg daily.  For her allergic rhinoconjunctivitis, we continue with Dymista as well as Zaditor and the eyedrops.  We did decide to increase the cat and dog dander in her neck set of allergy vials to provide some more protection.  We did not do a breathing test since her asthma was well controlled with Singulair and albuterol as needed.  Since last visit, she has mostly done well.  However, over the last 3 to 4 days, she has developed sinus congestion and frontal sinus  pain.  She has not had a fever with any of this. She has tried taking her nose spray without much relief. She is also using the cetirizine and headache medicine. No one else has been sick that she is aware of. She has been very weak and does endorse some myalgia.  She has not tried salt water rinses but is open to doing this.  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  She is willing to go get tested. She has not received her COVID-19 vaccine and is not sure she is going to anytime soon.   She is having some trouble with her allergy shots, although the epi washes seem to be helping.  However, when she does get a rash, they take a few days or upwards of a week to actually clear out.  She is open to spacing them out every 2 weeks to see if this helps at all.  Otherwise, there have been no changes to her past medical history, surgical history, family history, or social history.  Assessment and Plan:  Iley is a 34 y.o. female with:   Mild intermittent asthma, uncomplicated migraine.  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  She has had 3 to 4 days of sinus pressure and congestion.  Hopefully this is just viral at this point, but we are to treat with a  course of prednisone to see if this can kick start her healing process.  I also recommended that she enhance her nasal regimen to help with mucus clearance.  She is going to add on nasal saline rinses.  I did provide my phone number so she can call me over the weekend if she is not having any improvement.  I also strongly suggested that she get tested for COVID-19 given her unvaccinated status and her high risk job in the healthcare setting.  Regarding her allergy shots, I would like to possibly space these out to every 2 weeks to see if this can help  her with healing between the injections.  We discussed more when I see her on Wednesday.  I also want to take a look in her ears and possibly clean those out on Wednesday morning when she comes in for her shot.     Diagnostics: None.  Medication List:  Current Outpatient Medications  Medication Sig Dispense Refill  . albuterol (VENTOLIN HFA) 108 (90 Base) MCG/ACT inhaler Inhale 2 puffs into the lungs every 4 (four) hours as needed for wheezing or shortness of breath. 18 g 3  . Aspirin-Acetaminophen-Caffeine (GOODY HEADACHE PO) Take by mouth.    . Azelastine-Fluticasone (DYMISTA NA) Place into the nose.    . cetirizine (ZYRTEC) 10 MG tablet Take 1 tablet 1-2 times daily as needed. 60 tablet 5  . clobetasol ointment (TEMOVATE) 0.05 % Apply 1 application topically 2 (two) times daily as needed. 30 g 2  . DiazePAM (VALIUM PO) Take by mouth.    . EPINEPHrine 0.3 mg/0.3 mL IJ SOAJ injection INJECT INTO THE MUSCLE ONCE FOR 1 DOSE 1 each 2  . famotidine (PEPCID) 20 MG tablet Take one tablet 1-2 times daily as needed. 60 tablet 5  . fluticasone (FLOVENT HFA) 110 MCG/ACT inhaler Inhale 2 puffs into the lungs 2 (two) times daily. For 2 weeks during asthma flare. 1 Inhaler 5  . ibuprofen (ADVIL) 800 MG tablet Take 1 tablet (800 mg total) by mouth every 8 (eight) hours as needed. 21 tablet 0  . ketotifen (ALAWAY) 0.025 % ophthalmic solution 1 drop 2 (two) times daily.    Marland Kitchen levothyroxine (SYNTHROID, LEVOTHROID) 88 MCG tablet 1 tablet from Monday to Saturday. Take only 2 tablet every Sunday.    . LO LOESTRIN FE 1 MG-10 MCG / 10 MCG tablet TK 1 T PO QD  4  . montelukast (SINGULAIR) 10 MG tablet TAKE 1 TABLET(10 MG) BY MOUTH AT BEDTIME 30 tablet 5  . predniSONE (DELTASONE) 10 MG tablet Take two tablets (20mg ) twice daily for three days, then one tablet (10mg ) twice daily for three days, then STOP. 18 tablet 0  . Probiotic Product (PROBIOTIC DAILY PO) Take by mouth.    . triamcinolone (KENALOG) 0.025 %  ointment Apply 1 application topically 2 (two) times daily. 30 g 0   Current Facility-Administered Medications  Medication Dose Route Frequency Provider Last Rate Last Admin  . omalizumab ) injection 300 mg  300 mg Subcutaneous Q14 Days , MD   300 mg at 01/14/20 Alfonse Spruce   Allergies: Allergies  Allergen Reactions  . Amoxicillin   . Cefuroxime Axetil Rash  . Penicillins Rash   I reviewed her past medical history, social history, family history, and environmental history and no significant changes have been reported from previous visits.  Review of Systems  Constitutional: Negative.  Negative for fever.  HENT: Positive for sinus pressure and sinus pain. Negative for  congestion, ear discharge, ear pain, postnasal drip and rhinorrhea.        Positive for headache.  Eyes: Negative for pain, discharge, redness and itching.  Respiratory: Negative for cough, shortness of breath and wheezing.   Cardiovascular: Negative.  Negative for chest pain and palpitations.  Gastrointestinal: Negative for abdominal pain.  Endocrine: Negative for cold intolerance and heat intolerance.  Skin: Negative.  Negative for rash.  Allergic/Immunologic: Negative for environmental allergies and food allergies.  Neurological: Negative for dizziness and headaches.  Hematological: Does not bruise/bleed easily.    Objective:  Physical exam not obtained as encounter was done via telephone.   Previous notes and tests were reviewed.  I discussed the assessment and treatment plan with the patient. The patient was provided an opportunity to ask questions and all were answered. The patient agreed with the plan and demonstrated an understanding of the instructions.   The patient was advised to call back or seek an in-person evaluation if the symptoms worsen or if the condition fails to improve as anticipated.  I provided 13 minutes of non-face-to-face time during this encounter.  It was my  pleasure to participate in Brookneal Kutter's care today. Please feel free to contact me with any questions or concerns.   Sincerely,  Alfonse Spruce, MD

## 2020-01-27 DIAGNOSIS — J4540 Moderate persistent asthma, uncomplicated: Secondary | ICD-10-CM | POA: Diagnosis not present

## 2020-01-28 ENCOUNTER — Ambulatory Visit: Payer: BC Managed Care – PPO | Admitting: Allergy & Immunology

## 2020-01-28 ENCOUNTER — Ambulatory Visit (INDEPENDENT_AMBULATORY_CARE_PROVIDER_SITE_OTHER): Payer: BC Managed Care – PPO

## 2020-01-28 ENCOUNTER — Other Ambulatory Visit: Payer: Self-pay

## 2020-01-28 DIAGNOSIS — J302 Other seasonal allergic rhinitis: Secondary | ICD-10-CM

## 2020-01-28 DIAGNOSIS — J4540 Moderate persistent asthma, uncomplicated: Secondary | ICD-10-CM

## 2020-01-28 DIAGNOSIS — J4520 Mild intermittent asthma, uncomplicated: Secondary | ICD-10-CM

## 2020-02-04 ENCOUNTER — Ambulatory Visit (INDEPENDENT_AMBULATORY_CARE_PROVIDER_SITE_OTHER): Payer: BC Managed Care – PPO

## 2020-02-04 DIAGNOSIS — J309 Allergic rhinitis, unspecified: Secondary | ICD-10-CM

## 2020-02-10 DIAGNOSIS — L501 Idiopathic urticaria: Secondary | ICD-10-CM | POA: Diagnosis not present

## 2020-02-11 ENCOUNTER — Ambulatory Visit (INDEPENDENT_AMBULATORY_CARE_PROVIDER_SITE_OTHER): Payer: BC Managed Care – PPO

## 2020-02-11 ENCOUNTER — Other Ambulatory Visit: Payer: Self-pay

## 2020-02-11 DIAGNOSIS — J4520 Mild intermittent asthma, uncomplicated: Secondary | ICD-10-CM

## 2020-02-11 DIAGNOSIS — L501 Idiopathic urticaria: Secondary | ICD-10-CM | POA: Diagnosis not present

## 2020-02-18 ENCOUNTER — Ambulatory Visit (INDEPENDENT_AMBULATORY_CARE_PROVIDER_SITE_OTHER): Payer: BC Managed Care – PPO

## 2020-02-18 DIAGNOSIS — J309 Allergic rhinitis, unspecified: Secondary | ICD-10-CM | POA: Diagnosis not present

## 2020-02-24 DIAGNOSIS — L501 Idiopathic urticaria: Secondary | ICD-10-CM | POA: Diagnosis not present

## 2020-02-25 ENCOUNTER — Ambulatory Visit: Payer: Self-pay

## 2020-02-25 ENCOUNTER — Ambulatory Visit (INDEPENDENT_AMBULATORY_CARE_PROVIDER_SITE_OTHER): Payer: Commercial Managed Care - PPO

## 2020-02-25 DIAGNOSIS — L501 Idiopathic urticaria: Secondary | ICD-10-CM | POA: Diagnosis not present

## 2020-02-25 DIAGNOSIS — J4520 Mild intermittent asthma, uncomplicated: Secondary | ICD-10-CM

## 2020-02-25 DIAGNOSIS — J309 Allergic rhinitis, unspecified: Secondary | ICD-10-CM

## 2020-03-01 ENCOUNTER — Ambulatory Visit (INDEPENDENT_AMBULATORY_CARE_PROVIDER_SITE_OTHER): Payer: Commercial Managed Care - PPO

## 2020-03-01 DIAGNOSIS — J309 Allergic rhinitis, unspecified: Secondary | ICD-10-CM

## 2020-03-10 ENCOUNTER — Other Ambulatory Visit: Payer: Self-pay

## 2020-03-10 ENCOUNTER — Ambulatory Visit (INDEPENDENT_AMBULATORY_CARE_PROVIDER_SITE_OTHER): Payer: Commercial Managed Care - PPO

## 2020-03-10 DIAGNOSIS — J4520 Mild intermittent asthma, uncomplicated: Secondary | ICD-10-CM

## 2020-03-10 DIAGNOSIS — L501 Idiopathic urticaria: Secondary | ICD-10-CM | POA: Diagnosis not present

## 2020-03-15 ENCOUNTER — Other Ambulatory Visit: Payer: Self-pay | Admitting: *Deleted

## 2020-03-15 MED ORDER — XOLAIR 150 MG ~~LOC~~ SOLR: 300.0000 mg | SUBCUTANEOUS | 11 refills | Status: DC

## 2020-03-15 NOTE — Telephone Encounter (Signed)
Patient no longer has [employer]BS and has [employer] thru [facility]. Obtained approval and submitted ERX to [facility] shared pharmacy with approval and copay info included.  Tried to reach patient to advise same but voice mail full an unable to leave message.

## 2020-03-17 ENCOUNTER — Ambulatory Visit (INDEPENDENT_AMBULATORY_CARE_PROVIDER_SITE_OTHER): Payer: Commercial Managed Care - PPO

## 2020-03-17 DIAGNOSIS — J309 Allergic rhinitis, unspecified: Secondary | ICD-10-CM

## 2020-03-17 NOTE — Telephone Encounter (Signed)
Tried to call patient voice mail full

## 2020-03-24 ENCOUNTER — Ambulatory Visit: Payer: Self-pay

## 2020-03-24 ENCOUNTER — Ambulatory Visit (INDEPENDENT_AMBULATORY_CARE_PROVIDER_SITE_OTHER): Payer: Commercial Managed Care - PPO

## 2020-03-24 DIAGNOSIS — J309 Allergic rhinitis, unspecified: Secondary | ICD-10-CM | POA: Diagnosis not present

## 2020-03-24 NOTE — Telephone Encounter (Signed)
Called and spoke to patient and advised change over to specialty pharmacy and given number to [facility] shared pharmacy where [facility] sent along with copay and approval info

## 2020-03-31 ENCOUNTER — Ambulatory Visit (INDEPENDENT_AMBULATORY_CARE_PROVIDER_SITE_OTHER): Payer: Commercial Managed Care - PPO | Admitting: Allergy & Immunology

## 2020-03-31 ENCOUNTER — Other Ambulatory Visit: Payer: Self-pay

## 2020-03-31 ENCOUNTER — Encounter: Payer: Self-pay | Admitting: Allergy & Immunology

## 2020-03-31 VITALS — BP 108/66 | HR 80 | Temp 98.2°F | Resp 18

## 2020-03-31 DIAGNOSIS — J4520 Mild intermittent asthma, uncomplicated: Secondary | ICD-10-CM

## 2020-03-31 DIAGNOSIS — L501 Idiopathic urticaria: Secondary | ICD-10-CM | POA: Diagnosis not present

## 2020-03-31 DIAGNOSIS — J302 Other seasonal allergic rhinitis: Secondary | ICD-10-CM

## 2020-03-31 DIAGNOSIS — J3089 Other allergic rhinitis: Secondary | ICD-10-CM

## 2020-03-31 NOTE — Patient Instructions (Addendum)
1. Allergic urticaria - well controlled - We continue with Xolair every two weeks. - Continue with Xolair and allergy shots on the same day.  - Continue with cetirizine two tablets twice daily. - Continue with Singulair daily.   2. Allergic rhinitis/allergic conjunctivitis - Continue with Dymista 1-2 sprays per nostril up to twice daily as needed.  - Continue with Zatidor eye drops as needed.  - Continue with allergy shots: we are freezing both vials at 0.15 mL as your maintenance. - We are going to remix your allergy shots to have fewer molds in them next time.  - We will also increase the cat and dog in the shots as well. - We will continue with weekly allergy shots.   3. Mild intermittent asthma  - We did not bother with a breathing test today. - Continue with Singulair 10mg  daily.  - Continue Proventil 2 puffs every 4 hours as needed for cough or wheeze  4. Return in about 6 months (around 10/01/2020). This can be an in-person, a virtual Webex or a telephone follow up visit.   Please inform 13/10/2020 of any Emergency Department visits, hospitalizations, or changes in symptoms. Call us before going to the ED for breathing or allergy symptoms since we might be able to fit you in for a sick visit. Feel free to contact us anytime with any questions, problems, or concerns.  It was a pleasure to see you again today!  Websites that have reliable patient information: 1. American Academy of Asthma, Allergy, and Immunology: www.aaaai.org 2. Food Allergy Research and Education (FARE): foodallergy.org 3. Mothers of Asthmatics: http://www.asthmacommunitynetwork.org 4. American College of Allergy, Asthma, and Immunology: www.acaai.org   COVID-19 Vaccine Information can be found at: [place] For questions related to vaccine distribution or appointments, please email vaccine@[email or web address] or call [phone].     "Like" [phone] on  Facebook and Instagram for our latest updates!       HAPPY SPRING!  Make sure you are registered to vote! If you have moved or changed any of your contact information, you will need to get this updated before voting!  In some cases, you MAY be able to register to vote online: [place]

## 2020-03-31 NOTE — Progress Notes (Signed)
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 Allergic urticaria - well controlled - We continue with Xolair every two weeks. - Continue with Xolair and allergy shots on the same day.  - Continue with cetirizine two tablets twice daily. - Continue with Singulair daily.   2. Allergic rhinitis/allergic conjunctivitis - Continue with Dymista 1-2 sprays per nostril up to twice daily as needed.  - Continue with Zatidor eye drops as needed.  - Continue with allergy shots: we are freezing both vials at 0.15 mL as your maintenance. - We are going to remix your allergy shots to have fewer molds in them next time.  - We will also increase the cat and dog in the shots as well. - We will continue with weekly allergy shots.   3. Mild intermittent asthma  - We did not bother with a breathing test today. - Continue with Singulair 10mg  daily.  - Continue Proventil 2 puffs every 4 hours as needed for cough or wheeze  4. Return in about 6 months (around 10/01/2020). This can be an in-person, a virtual Webex or a telephone follow up visit.   Subjective:   Jennifer [name] is a 34 y.o. female presenting today for follow up of  Chief Complaint  Patient presents with  . Immunotherapy Issue    patient received injections 03-24-20. initial local reaction showed up later that day, progressively worsened. then she developed another large red area below the original reaction. The area is still red, somewhat puffy and warm to the touch. She says that there have been issues like this before, they have just been getting worse.    03-17-1990 has a history of the following: Patient  Active Problem List   Diagnosis Date Noted  . Mild persistent asthma without complication 09/17/2019  . Seasonal and perennial allergic rhinitis 09/17/2019  . Chronic idiopathic urticaria 09/17/2019  . Seasonal allergic conjunctivitis 09/17/2019    History obtained from: chart review and patient.  Jennifer [name] is a 34 y.o. female presenting for a follow up visit.  She was last seen in March 2021.  At that time, she presented for a sick visit.  She had acute worsening of sinus pressure and congestion.  We treated with a course of prednisone to see if this would help.  She was already experiencing large local reactions at that time.  We recommended spacing out every 2 weeks to see if it would help with the healing process.  She was last seen before that in January 2021.  At that time, her urticaria were not under good control.  We increased her Xolair to every 2 weeks.  For her allergic rhinoconjunctivitis, we continued with Dymista as well as Zaditor and her allergy shots.  We did discuss increasing the cat and dog dander in her next set of allergy vials.  Asthma was under good control with albuterol and Singulair 10 mg daily.  Since last visit, she unfortunately had to miss a dose of Xolair because of an insurance issue.  She is in the process of getting it approved again with her new insurance.  For a while, she was paying for her insurance out-of-pocket when she was only working part-time.  She is now working full-time and has full coverage again at this point.  She is concerned with the large local reactions that she is having.  We were adding on the Epley rinses, but unfortunately these were still leading to fairly significant late onset reactions that persisted for a week or more.  She does show me several of the lesions on her arms today. She is interested in pursuing allergy shots still, but she would like to make some changes to try to decrease the reactions she is having.  Laycie's asthma has been  well controlled. She has not required rescue medication, experienced nocturnal awakenings due to lower respiratory symptoms, nor have activities of daily living been limited. She has required no Emergency Department or Urgent Care visits for her asthma. She has required zero courses of systemic steroids for asthma exacerbations since the last visit. ACT score today is 25, indicating excellent asthma symptom control.   She does have a dog and a cat. She adopted them during the pandemic and thought that she would be fine during that time since she was on shots.   Otherwise, there have been no changes to her past medical history, surgical history, family history, or social history.    Review of Systems  Constitutional: Negative.  Negative for chills, fever, malaise/fatigue and weight loss.  HENT: Negative.  Negative for congestion, ear discharge, ear pain and sore throat.   Eyes: Negative for pain, discharge and redness.  Respiratory: Negative for cough, sputum production, shortness of breath and wheezing.   Cardiovascular: Negative.  Negative for chest pain and palpitations.  Gastrointestinal: Negative for abdominal pain, constipation, diarrhea, heartburn, nausea and vomiting.  Skin: Positive for itching and rash.  Neurological: Negative for dizziness and headaches.  Endo/Heme/Allergies: Negative for environmental allergies. Does not bruise/bleed easily.       Objective:   Blood pressure 108/66, pulse 80, temperature 98.2 F (36.8 C), temperature source Temporal, resp. rate 18, SpO2 99 %. There is no height or weight on file to calculate BMI.   Physical Exam:  Physical Exam  Constitutional: She appears well-developed.  Pleasant female. Cooperative with the exam.   HENT:  Head: Normocephalic and atraumatic.  Right Ear: Tympanic membrane, external ear and ear canal normal.  Left Ear: Tympanic membrane, external ear and ear canal normal.  Nose: Mucosal edema and rhinorrhea present.  No nasal deformity or septal deviation. No epistaxis. Right sinus exhibits no maxillary sinus tenderness and no frontal sinus tenderness. Left sinus exhibits no maxillary sinus tenderness and no frontal sinus tenderness.  Mouth/Throat: Uvula is midline and oropharynx is clear and moist. Mucous membranes are not pale and not dry.  Eyes: Pupils are equal, round, and reactive to light. Conjunctivae and EOM are normal. Right eye exhibits no chemosis and no discharge. Left eye exhibits no chemosis and no discharge. Right conjunctiva is not injected. Left conjunctiva is not injected.  Cardiovascular: Normal rate, regular rhythm and normal heart sounds.  Respiratory: Effort normal and breath sounds normal. No accessory muscle usage. No tachypnea. No respiratory distress. She has no wheezes. She has no rhonchi. She has no rales. She exhibits no tenderness.  Moving air well in all lung fields. No increased work of breathing noted.   Lymphadenopathy:    She has no cervical adenopathy.  Neurological: She is alert.  Skin: No abrasion, no petechiae and no rash noted. Rash is not papular, not vesicular and not urticarial. No erythema. No pallor.  She does have some hyperpigmented  fairly large lesions on her bilateral arms where she received allergy injections.   Psychiatric: She has a normal mood and affect.     Diagnostic studies: none       Malachi Bonds, MD  [facility]

## 2020-04-01 DIAGNOSIS — J3081 Allergic rhinitis due to animal (cat) (dog) hair and dander: Secondary | ICD-10-CM | POA: Diagnosis not present

## 2020-04-01 NOTE — Progress Notes (Signed)
VIALS EXP 04-01-21 

## 2020-04-05 DIAGNOSIS — J3089 Other allergic rhinitis: Secondary | ICD-10-CM | POA: Diagnosis not present

## 2020-04-07 ENCOUNTER — Ambulatory Visit (INDEPENDENT_AMBULATORY_CARE_PROVIDER_SITE_OTHER): Payer: Commercial Managed Care - PPO

## 2020-04-07 DIAGNOSIS — J309 Allergic rhinitis, unspecified: Secondary | ICD-10-CM | POA: Diagnosis not present

## 2020-04-14 ENCOUNTER — Ambulatory Visit (INDEPENDENT_AMBULATORY_CARE_PROVIDER_SITE_OTHER): Payer: Commercial Managed Care - PPO

## 2020-04-14 DIAGNOSIS — J309 Allergic rhinitis, unspecified: Secondary | ICD-10-CM | POA: Diagnosis not present

## 2020-04-21 ENCOUNTER — Ambulatory Visit (INDEPENDENT_AMBULATORY_CARE_PROVIDER_SITE_OTHER): Payer: Commercial Managed Care - PPO

## 2020-04-21 DIAGNOSIS — J309 Allergic rhinitis, unspecified: Secondary | ICD-10-CM

## 2020-04-28 ENCOUNTER — Ambulatory Visit (INDEPENDENT_AMBULATORY_CARE_PROVIDER_SITE_OTHER): Payer: Commercial Managed Care - PPO

## 2020-04-28 DIAGNOSIS — J309 Allergic rhinitis, unspecified: Secondary | ICD-10-CM | POA: Diagnosis not present

## 2020-05-05 ENCOUNTER — Ambulatory Visit (INDEPENDENT_AMBULATORY_CARE_PROVIDER_SITE_OTHER): Payer: Commercial Managed Care - PPO

## 2020-05-05 DIAGNOSIS — J309 Allergic rhinitis, unspecified: Secondary | ICD-10-CM

## 2020-05-12 ENCOUNTER — Ambulatory Visit (INDEPENDENT_AMBULATORY_CARE_PROVIDER_SITE_OTHER): Payer: Commercial Managed Care - PPO

## 2020-05-12 DIAGNOSIS — J309 Allergic rhinitis, unspecified: Secondary | ICD-10-CM | POA: Diagnosis not present

## 2020-05-19 ENCOUNTER — Ambulatory Visit (INDEPENDENT_AMBULATORY_CARE_PROVIDER_SITE_OTHER): Payer: Commercial Managed Care - PPO

## 2020-05-19 DIAGNOSIS — J309 Allergic rhinitis, unspecified: Secondary | ICD-10-CM

## 2020-05-26 ENCOUNTER — Ambulatory Visit (INDEPENDENT_AMBULATORY_CARE_PROVIDER_SITE_OTHER): Payer: Commercial Managed Care - PPO

## 2020-05-26 DIAGNOSIS — J309 Allergic rhinitis, unspecified: Secondary | ICD-10-CM

## 2020-06-02 ENCOUNTER — Ambulatory Visit (INDEPENDENT_AMBULATORY_CARE_PROVIDER_SITE_OTHER): Payer: Commercial Managed Care - PPO

## 2020-06-02 DIAGNOSIS — J309 Allergic rhinitis, unspecified: Secondary | ICD-10-CM

## 2020-06-09 ENCOUNTER — Ambulatory Visit (INDEPENDENT_AMBULATORY_CARE_PROVIDER_SITE_OTHER): Payer: Commercial Managed Care - PPO

## 2020-06-09 DIAGNOSIS — J309 Allergic rhinitis, unspecified: Secondary | ICD-10-CM

## 2020-06-16 ENCOUNTER — Ambulatory Visit (INDEPENDENT_AMBULATORY_CARE_PROVIDER_SITE_OTHER): Payer: Commercial Managed Care - PPO

## 2020-06-16 DIAGNOSIS — J309 Allergic rhinitis, unspecified: Secondary | ICD-10-CM

## 2020-06-16 IMAGING — DX DG ANKLE COMPLETE 3+V*L*
3 series · 3 of 3 positions shown · non-contrast
Comparison: None.

CLINICAL DATA: Per pt: at work around [DATE], at work, got tangled up
in the power connector cable, fell forward and twisted the left
ankle. Pain is the left ankle, swelling and pain lateral malleolus

EXAM:
LEFT ANKLE COMPLETE - 3+ VIEW

[ankle ap]
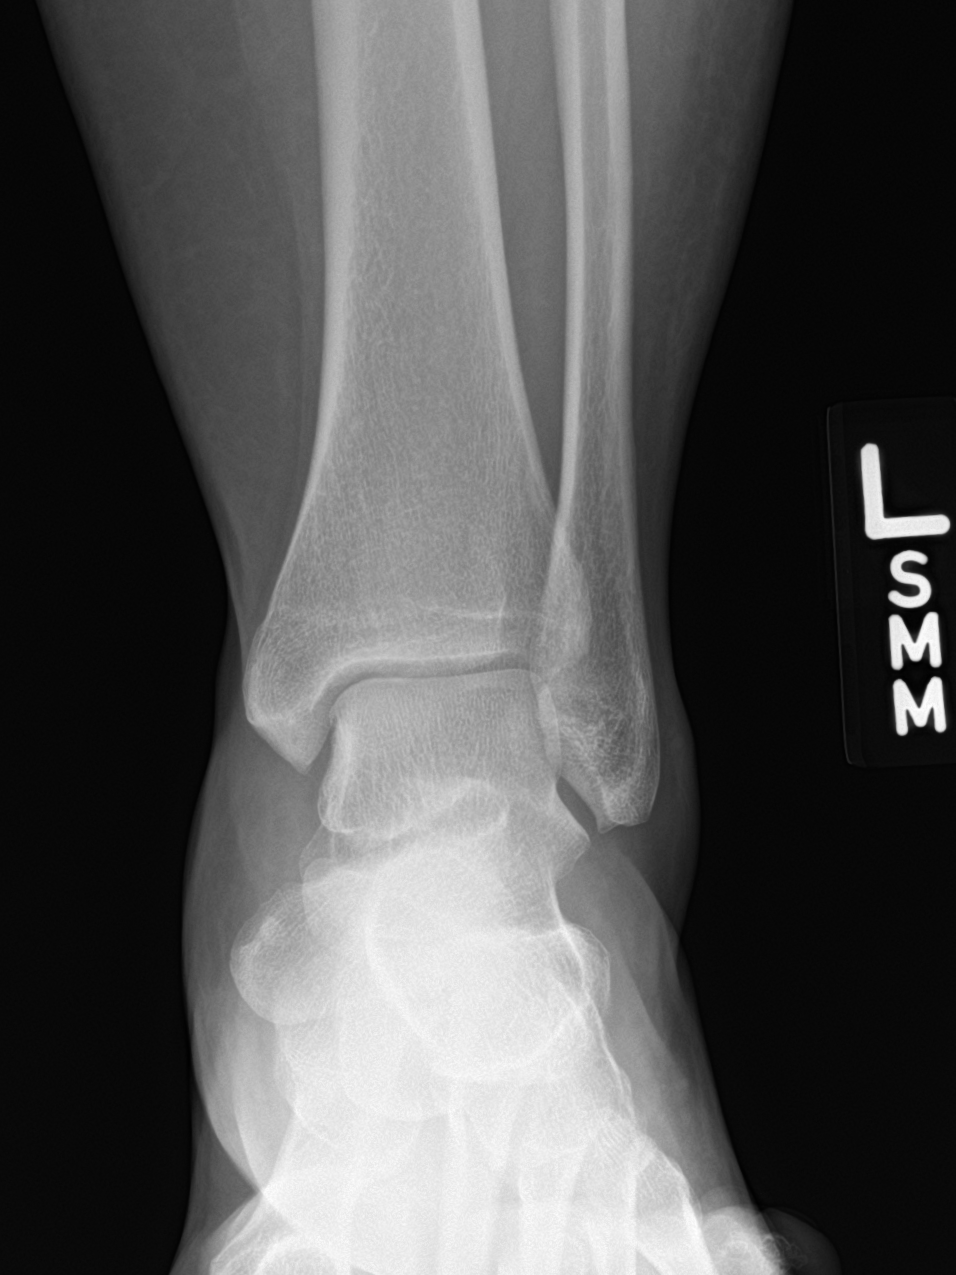

[ankle obl]
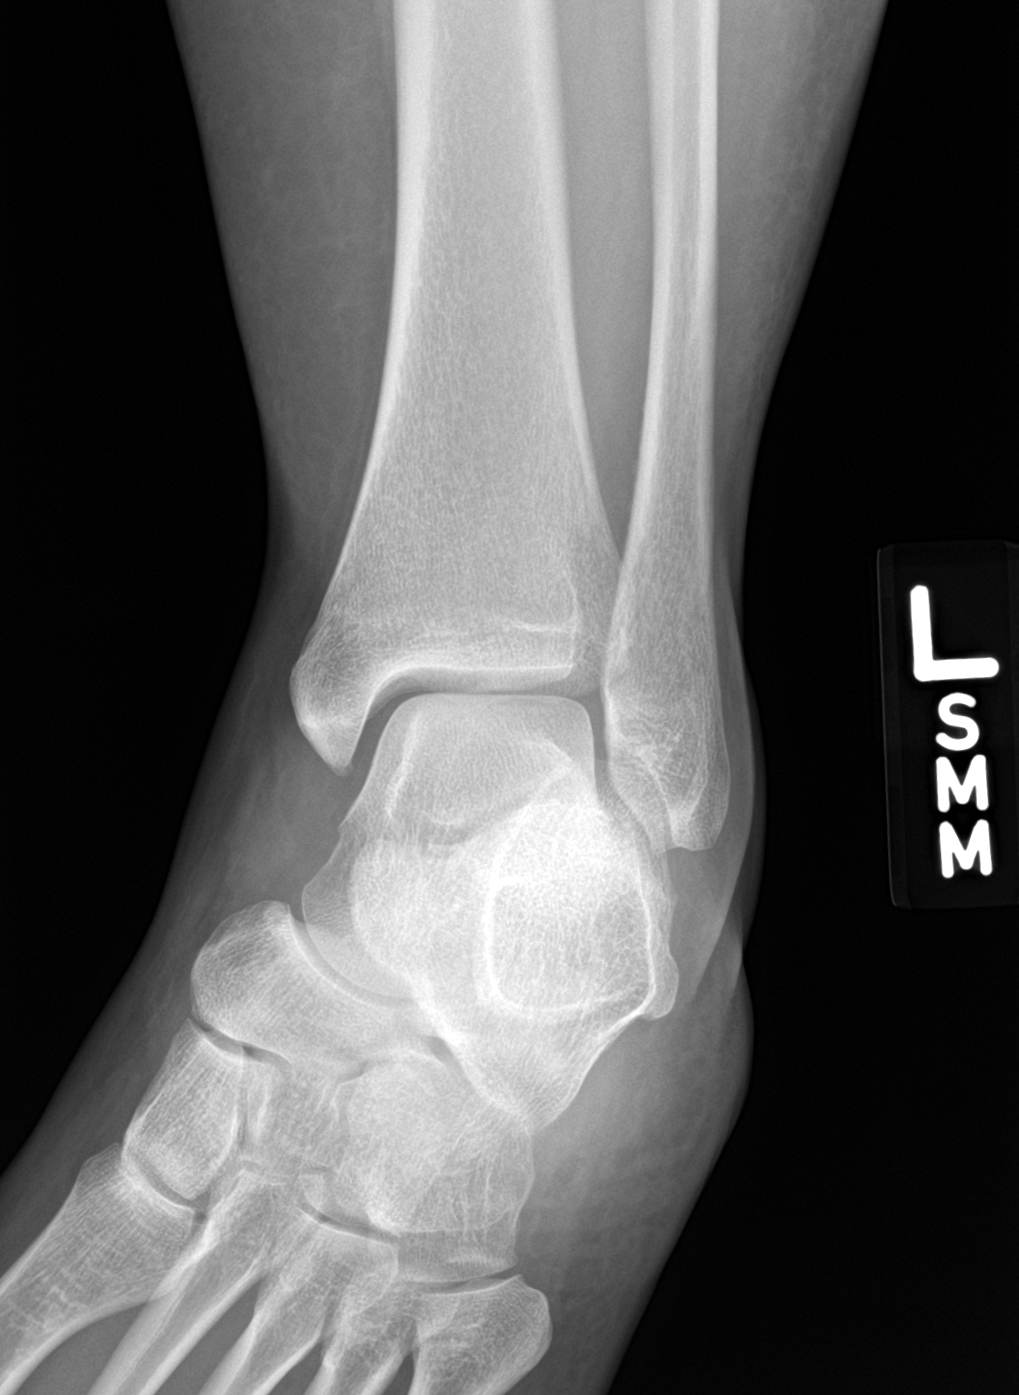

[ankle lat]
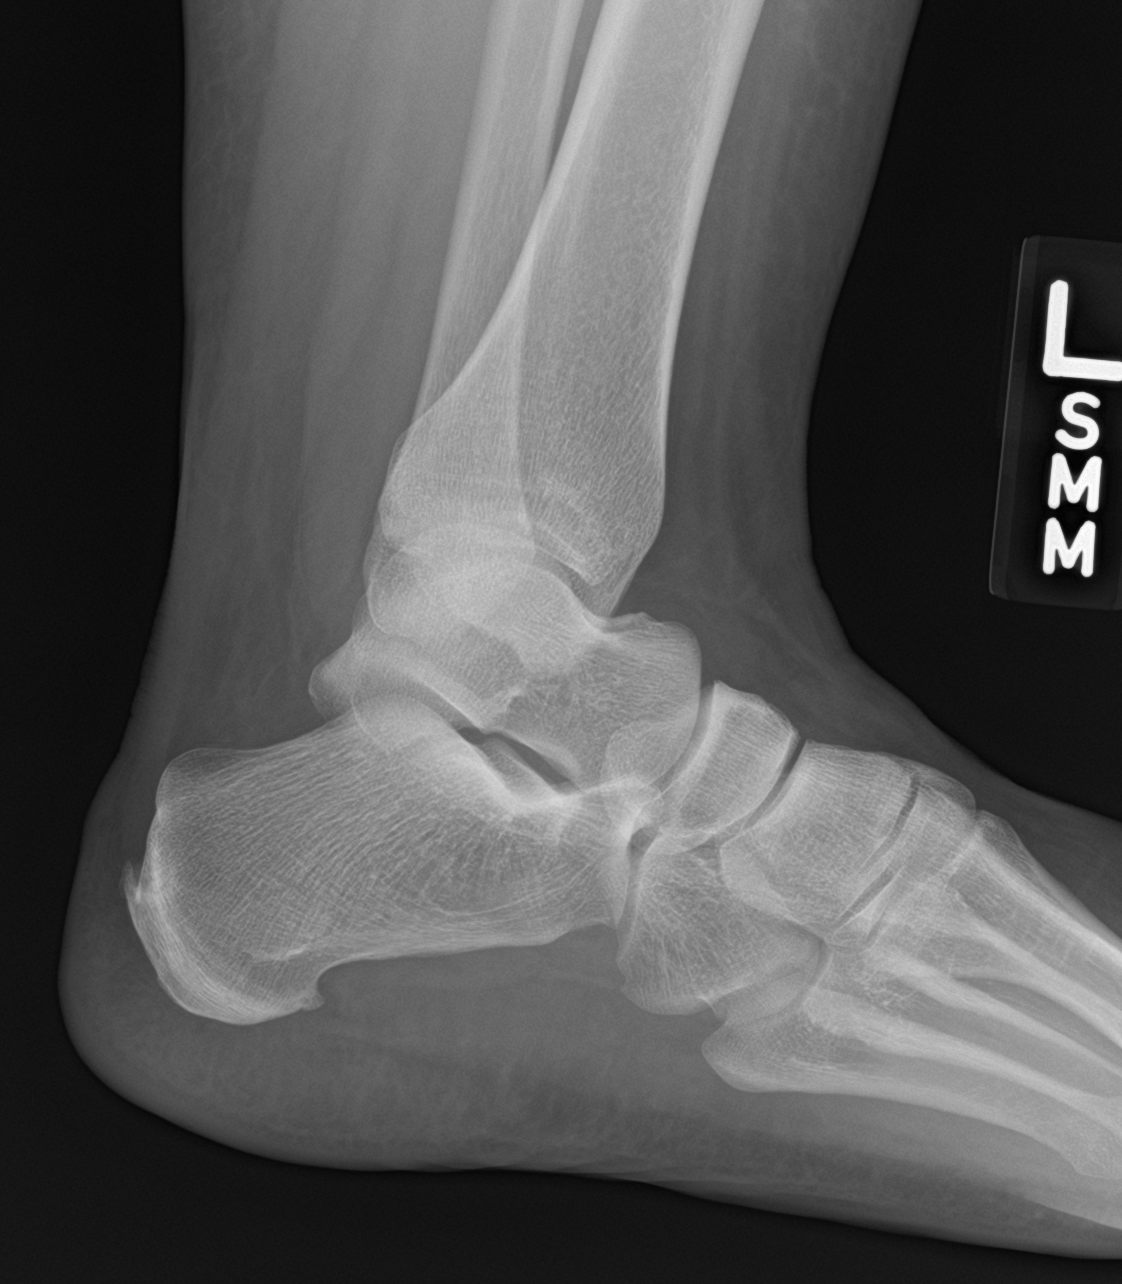

[3 of 3 positions shown; findings below may reference images not displayed]

FINDINGS: There is no evidence of fracture, dislocation, or joint effusion.
Talar dome is intact. Tibiotalar joint space is maintained. There is
no evidence of arthropathy or other focal bone abnormality. Soft
tissues are unremarkable.
IMPRESSION: No acute osseous abnormality in the left foot.

## 2020-06-30 ENCOUNTER — Ambulatory Visit (INDEPENDENT_AMBULATORY_CARE_PROVIDER_SITE_OTHER): Payer: Commercial Managed Care - PPO

## 2020-06-30 DIAGNOSIS — J309 Allergic rhinitis, unspecified: Secondary | ICD-10-CM

## 2020-07-07 ENCOUNTER — Ambulatory Visit (INDEPENDENT_AMBULATORY_CARE_PROVIDER_SITE_OTHER): Payer: Commercial Managed Care - PPO

## 2020-07-07 DIAGNOSIS — J309 Allergic rhinitis, unspecified: Secondary | ICD-10-CM | POA: Diagnosis not present

## 2020-07-14 ENCOUNTER — Ambulatory Visit (INDEPENDENT_AMBULATORY_CARE_PROVIDER_SITE_OTHER): Payer: Commercial Managed Care - PPO

## 2020-07-14 DIAGNOSIS — J309 Allergic rhinitis, unspecified: Secondary | ICD-10-CM

## 2020-07-15 DIAGNOSIS — J3081 Allergic rhinitis due to animal (cat) (dog) hair and dander: Secondary | ICD-10-CM

## 2020-07-15 NOTE — Progress Notes (Signed)
VIALS EXP 07-15-21

## 2020-07-21 ENCOUNTER — Ambulatory Visit (INDEPENDENT_AMBULATORY_CARE_PROVIDER_SITE_OTHER): Payer: Commercial Managed Care - PPO

## 2020-07-21 DIAGNOSIS — J309 Allergic rhinitis, unspecified: Secondary | ICD-10-CM | POA: Diagnosis not present

## 2020-07-28 ENCOUNTER — Ambulatory Visit (INDEPENDENT_AMBULATORY_CARE_PROVIDER_SITE_OTHER): Payer: Commercial Managed Care - PPO

## 2020-07-28 DIAGNOSIS — J309 Allergic rhinitis, unspecified: Secondary | ICD-10-CM | POA: Diagnosis not present

## 2020-08-04 ENCOUNTER — Ambulatory Visit (INDEPENDENT_AMBULATORY_CARE_PROVIDER_SITE_OTHER): Payer: Commercial Managed Care - PPO

## 2020-08-04 DIAGNOSIS — J309 Allergic rhinitis, unspecified: Secondary | ICD-10-CM

## 2020-08-11 ENCOUNTER — Ambulatory Visit (INDEPENDENT_AMBULATORY_CARE_PROVIDER_SITE_OTHER): Payer: Commercial Managed Care - PPO

## 2020-08-11 DIAGNOSIS — J309 Allergic rhinitis, unspecified: Secondary | ICD-10-CM

## 2020-08-18 ENCOUNTER — Ambulatory Visit (INDEPENDENT_AMBULATORY_CARE_PROVIDER_SITE_OTHER): Payer: Commercial Managed Care - PPO

## 2020-08-18 DIAGNOSIS — J309 Allergic rhinitis, unspecified: Secondary | ICD-10-CM | POA: Diagnosis not present

## 2020-08-18 MED ORDER — FLUTICASONE PROPIONATE 50 MCG/ACT NA SUSP
2.0000 | Freq: Every day | NASAL | 5 refills | Status: DC | PRN
Start: 2020-08-18 — End: 2021-11-04

## 2020-08-18 MED ORDER — AZELASTINE HCL 0.1 % NA SOLN: 2.0000 | Freq: Two times a day (BID) | NASAL | 5 refills | Status: DC

## 2020-08-25 ENCOUNTER — Ambulatory Visit (INDEPENDENT_AMBULATORY_CARE_PROVIDER_SITE_OTHER): Payer: Commercial Managed Care - PPO

## 2020-08-25 DIAGNOSIS — J309 Allergic rhinitis, unspecified: Secondary | ICD-10-CM | POA: Diagnosis not present

## 2020-09-01 ENCOUNTER — Ambulatory Visit (INDEPENDENT_AMBULATORY_CARE_PROVIDER_SITE_OTHER): Payer: Commercial Managed Care - PPO

## 2020-09-01 DIAGNOSIS — J309 Allergic rhinitis, unspecified: Secondary | ICD-10-CM

## 2020-09-15 ENCOUNTER — Ambulatory Visit (INDEPENDENT_AMBULATORY_CARE_PROVIDER_SITE_OTHER): Payer: Commercial Managed Care - PPO | Admitting: Urology

## 2020-09-15 ENCOUNTER — Encounter: Payer: Self-pay | Admitting: Urology

## 2020-09-15 ENCOUNTER — Other Ambulatory Visit: Payer: Self-pay

## 2020-09-15 VITALS — BP 120/80 | HR 99 | Temp 98.1°F | Ht 62.0 in | Wt 225.0 lb

## 2020-09-15 DIAGNOSIS — R102 Pelvic and perineal pain: Secondary | ICD-10-CM | POA: Diagnosis not present

## 2020-09-15 DIAGNOSIS — R350 Frequency of micturition: Secondary | ICD-10-CM

## 2020-09-15 LAB — URINALYSIS, ROUTINE W REFLEX MICROSCOPIC
Bilirubin, UA: NEGATIVE
Glucose, UA: NEGATIVE
Ketones, UA: NEGATIVE
Nitrite, UA: NEGATIVE
Protein,UA: NEGATIVE
RBC, UA: NEGATIVE
Specific Gravity, UA: 1.02 (ref 1.005–1.030)
Urobilinogen, Ur: 1 mg/dL (ref 0.2–1.0)
pH, UA: 6 (ref 5.0–7.5)

## 2020-09-15 LAB — MICROSCOPIC EXAMINATION: RBC, Urine: NONE SEEN /hpf (ref 0–2)

## 2020-09-15 LAB — BLADDER SCAN AMB NON-IMAGING: Scan Result: 0

## 2020-09-15 MED ORDER — MIRABEGRON ER 50 MG PO TB24: 50.0000 mg | ORAL_TABLET | Freq: Every day | ORAL | 0 refills | Status: DC

## 2020-09-15 MED ORDER — DIAZEPAM 5 MG PO TABS: 5.0000 mg | ORAL_TABLET | Freq: Two times a day (BID) | ORAL | 1 refills | Status: DC | PRN

## 2020-09-15 NOTE — Progress Notes (Signed)
09/15/2020 9:39 AM   Jennifer [name] 1985-12-11 [id number]  Referring provider: Selinda Flavin, MD [address] [place],  [place] [address]  Pelvic pain  HPI: Jennifer [name] is a 34yo here for evaluation of pelvic pain. In the past year she was laid off and then her father was sick and she noted increased pelvic pain and pressure. She has associated low back pain. She has been treated with 2 courses of antibiotics. She previously saw pelvic floor PT which did imporve her pelvic pain . No issues urinating   Her records from [facility] are as follows: I have pelvic pain.  HPI: Jennifer [name] is a 34 year-old female established patient who is here for pelvic pain.  The problem is on both sides. She first noticed the pain approximately 08/21/2011. She has had this kind of pain before. The pain is not related to her menstrual period.   Her pain is sharp. Her pain is intermittent. Nothing causes the pain to become worse. Urinating and pain medications< makes the pain better.   She does not have pain with bowel movements. She does not have trouble with constipation.   08/24/2017: valium and motrin partially helped her pelvis pain. She continues to have suprapubic pressure   12/07/2017: She has seen E[facility] which have significantly improved her pelvic pressure. She has not used motrin and valium within the last week   04/16/2018: She has noted worsening suprapubic pressure and pain which is worse with stress. The symptoms improved on prednisone. She stopped her zoloft. She has been out of her valium.   10/29/2018: She has since stopped Pelvic Floor PT since she was no longer making progress. Her internal pain has resolved. She has external muscle pressure.     CC: I urinate too frequently in the daytime.  HPI: She first noticed the symptom approximately 11/20/2014. She does urinate more frequently than once every 4 hours in the daytime. She does not urinate more frequently than once every 2 hours in the  daytime.   She does not usually have swelling in her hands and feet during the day. She does not take a diuretic. She does not have to strain or bear down to start her urinary stream. She does not have an abnormal sensation when needing to urinate.   She is not having problems with urinary control or incontinence.   12/07/2017: She has longstanding urinary frequency. 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 She has seen pelvic floor PT which has taught her to relax and empty her bladder. She continues to have urinary frequency every 1.5-2 hours.   04/16/2018: Frequency improved with mirabegron but it raised her blood pressure. She noted improved frequency with drinking more water, stress reduction.   10/29/2018: Urinary frequency has improved with pelvic floor PT. She was started on an antianxiety medication which has helped her frequency. no nocturia      PMH: Past Medical History:  Diagnosis Date   Anxiety    Asthma    Eczema    GERD (gastroesophageal reflux disease)    GERD (gastroesophageal reflux disease)    Thyroid disease    Thyroid disease    Urticaria     Surgical History: Past Surgical History:  Procedure Laterality Date   REPLACEMENT OF URETER     Ureters reimplanted in bladder   WISDOM TOOTH EXTRACTION  2004    Home Medications:  Allergies as of 09/15/2020  Reactions   Amoxicillin    Cefuroxime Axetil Rash   Penicillins Rash      Medication List       Accurate as of September 15, 2020  9:39 AM. If you have any questions, ask your nurse or doctor.        Alaway 0.025 % ophthalmic solution Generic drug: ketotifen 1 drop 2 (two) times daily.   albuterol 108 (90 Base) MCG/ACT inhaler Commonly known as: VENTOLIN HFA Inhale 2 puffs into the lungs every 4 (four) hours as needed for wheezing or shortness of breath.   azelastine 0.1 % nasal spray Commonly known as: ASTELIN Place 2 sprays into both nostrils 2 (two) times daily.   B  COMPLEX-B12 PO Take by mouth.   cetirizine 10 MG tablet Commonly known as: ZYRTEC Take 1 tablet 1-2 times daily as needed.   cholecalciferol 25 MCG (1000 UNIT) tablet Commonly known as: VITAMIN D3 Take 5,000 Units by mouth daily.   clobetasol ointment 0.05 % Commonly known as: TEMOVATE Apply 1 application topically 2 (two) times daily as needed.   doxycycline 100 MG tablet Commonly known as: VIBRA-TABS Take 100 mg by mouth 2 (two) times daily.   EPINEPHrine 0.3 mg/0.3 mL Soaj injection Commonly known as: EPI-PEN INJECT INTO THE MUSCLE ONCE FOR 1 DOSE   famotidine 20 MG tablet Commonly known as: Pepcid Take one tablet 1-2 times daily as needed.   Flovent HFA 110 MCG/ACT inhaler Generic drug: fluticasone Inhale 2 puffs into the lungs 2 (two) times daily. For 2 weeks during asthma flare.   fluticasone 50 MCG/ACT nasal spray Commonly known as: FLONASE Place 2 sprays into both nostrils daily as needed for allergies or rhinitis.   folic acid 1 MG tablet Commonly known as: FOLVITE Take 1 mg by mouth daily.   GOODY HEADACHE PO Take by mouth.   ibuprofen 800 MG tablet Commonly known as: ADVIL Take 1 tablet (800 mg total) by mouth every 8 (eight) hours as needed.   levothyroxine 88 MCG tablet Commonly known as: SYNTHROID 1 tablet from Monday to Saturday. Take only 2 tablet every Sunday.   Lo Loestrin Fe 1 MG-10 MCG / 10 MCG tablet Generic drug: Norethindrone-Ethinyl Estradiol-Fe Biphas TK 1 T PO QD   Magnesium 250 MG Tabs Take by mouth.   montelukast 10 MG tablet Commonly known as: SINGULAIR TAKE 1 TABLET(10 MG) BY MOUTH AT BEDTIME   nitrofurantoin (macrocrystal-monohydrate) 100 MG capsule Commonly known as: MACROBID Take 100 mg by mouth 2 (two) times daily.   PROBIOTIC DAILY PO Take by mouth.   sertraline 100 MG tablet Commonly known as: ZOLOFT sertraline 100 mg tablet  TAKE 1 TABLET BY MOUTH EVERY DAY   triamcinolone 0.025 % ointment Commonly known  as: KENALOG Apply 1 application topically 2 (two) times daily.   VALIUM PO Take by mouth.   Xolair 150 MG injection Generic drug: omalizumab Inject 300 mg into the skin every 14 (fourteen) days.       Allergies:  Allergies  Allergen Reactions   Amoxicillin    Cefuroxime Axetil Rash   Penicillins Rash    Family History: Family History  Problem Relation Age of Onset   Asthma Mother    Urticaria Mother    Allergic rhinitis Mother    Asthma Brother    Eczema Brother    Allergic rhinitis Brother    Osteoarthritis Father     Social History:  reports that she has never smoked. She has never used smokeless tobacco.  She reports that she does not drink alcohol and does not use drugs.  ROS: All other review of systems were reviewed and are negative except what is noted above in HPI  Physical Exam: BP 120/80    Pulse 99    Temp 98.1 F (36.7 C)    Ht 5\' 2"  (1.575 m)    Wt 225 lb (102.1 kg)    BMI 41.15 kg/m   Constitutional:  Alert and oriented, No acute distress. HEENT: [place] AT, moist mucus membranes.  Trachea midline, no masses. Cardiovascular: No clubbing, cyanosis, or edema. Respiratory: Normal respiratory effort, no increased work of breathing. GI: Abdomen is soft, nontender, nondistended, no abdominal masses GU: No CVA tenderness.  Lymph: No cervical or inguinal lymphadenopathy. Skin: No rashes, bruises or suspicious lesions. Neurologic: Grossly intact, no focal deficits, moving all 4 extremities. Psychiatric: Normal mood and affect.  Laboratory Data: Lab Results  Component Value Date   WBC 8.1 09/04/2018   HGB 12.5 09/04/2018   HCT 37.4 09/04/2018   MCV 91 09/04/2018   PLT 448 09/04/2018    Lab Results  Component Value Date   CREATININE 0.83 09/04/2018    No results found for: PSA  No results found for: TESTOSTERONE  No results found for: HGBA1C  Urinalysis No results found for: COLORURINE, APPEARANCEUR, LABSPEC, PHURINE, GLUCOSEU, HGBUR,  BILIRUBINUR, KETONESUR, PROTEINUR, UROBILINOGEN, NITRITE, LEUKOCYTESUR  No results found for: LABMICR, WBCUA, RBCUA, LABEPIT, MUCUS, BACTERIA  Pertinent Imaging:  No results found for this or any previous visit.  No results found for this or any previous visit.  No results found for this or any previous visit.  No results found for this or any previous visit.  No results found for this or any previous visit.  No results found for this or any previous visit.  No results found for this or any previous visit.  No results found for this or any previous visit.   Assessment & Plan:    1. Urinary frequency -Mirabegron 25mg  daily - Urinalysis, Routine w reflex microscopic - BLADDER SCAN AMB NON-IMAGING  2. Pelvic pain in female Valium 5mg  TID PRN   No follow-ups on file.  09/06/2018, MD  [facility] [place]

## 2020-09-15 NOTE — Progress Notes (Signed)
PVR=0  Urological Symptom Review  Patient is experiencing the following symptoms: Frequent urination Hard to postpone urination Leakage of urine Urinary tract infection   Review of Systems  Gastrointestinal (upper)  : Nausea Indigestion/heartburn  Gastrointestinal (lower) : Diarrhea  Constitutional : Fever Fatigue  Skin: Itching  Eyes: Negative for eye symptoms  Ear/Nose/Throat : Sore throat Sinus problems  Hematologic/Lymphatic: Easy bruising  Cardiovascular : Chest pain  Respiratory : Cough  Endocrine: Negative for endocrine symptoms  Musculoskeletal: Back pain  Neurological: Headaches  Psychologic: Depression Anxiety

## 2020-09-15 NOTE — Patient Instructions (Signed)
Pelvic Pain, Female Pelvic pain is pain in your lower belly (abdomen), below your belly button and between your hips. The pain may start suddenly (be acute), keep coming back (be recurring), or last a long time (become chronic). Pelvic pain that lasts longer than 6 months is called chronic pelvic pain. There are many causes of pelvic pain. Sometimes the cause of pelvic pain is not known. Follow these instructions at home:   Take over-the-counter and prescription medicines only as told by your doctor.  Rest as told by your doctor.  Do not have sex if it hurts.  Keep a journal of your pelvic pain. Write down: ? When the pain started. ? Where the pain is located. ? What seems to make the pain better or worse, such as food or your period (menstrual cycle). ? Any symptoms you have along with the pain.  Keep all follow-up visits as told by your doctor. This is important. Contact a doctor if:  Medicine does not help your pain.  Your pain comes back.  You have new symptoms.  You have unusual discharge or bleeding from your vagina.  You have a fever or chills.  You are having trouble pooping (constipation).  You have blood in your pee (urine) or poop (stool).  Your pee smells bad.  You feel weak or light-headed. Get help right away if:  You have sudden pain that is very bad.  Your pain keeps getting worse.  You have very bad pain and also have any of these symptoms: ? A fever. ? Feeling sick to your stomach (nausea). ? Throwing up (vomiting). ? Being very sweaty.  You pass out (lose consciousness). Summary  Pelvic pain is pain in your lower belly (abdomen), below your belly button and between your hips.  There are many possible causes of pelvic pain.  Keep a journal of your pelvic pain. This information is not intended to replace advice given to you by your health care provider. Make sure you discuss any questions you have with your health care provider. Document  Revised: 04/24/2018 Document Reviewed: 04/24/2018 [employer]vier Patient Education  2020 [employer]  

## 2020-09-22 ENCOUNTER — Ambulatory Visit (INDEPENDENT_AMBULATORY_CARE_PROVIDER_SITE_OTHER): Payer: Commercial Managed Care - PPO

## 2020-09-22 DIAGNOSIS — J309 Allergic rhinitis, unspecified: Secondary | ICD-10-CM | POA: Diagnosis not present

## 2020-09-26 ENCOUNTER — Other Ambulatory Visit: Payer: Self-pay | Admitting: Family Medicine

## 2020-09-29 ENCOUNTER — Encounter: Payer: Self-pay | Admitting: Allergy & Immunology

## 2020-09-29 ENCOUNTER — Other Ambulatory Visit: Payer: Self-pay

## 2020-09-29 ENCOUNTER — Other Ambulatory Visit: Payer: Self-pay | Admitting: Family Medicine

## 2020-09-29 ENCOUNTER — Ambulatory Visit: Payer: Commercial Managed Care - PPO | Admitting: Allergy & Immunology

## 2020-09-29 VITALS — BP 104/60 | HR 92 | Temp 98.2°F | Resp 18 | Ht 62.0 in | Wt 228.0 lb

## 2020-09-29 DIAGNOSIS — J302 Other seasonal allergic rhinitis: Secondary | ICD-10-CM

## 2020-09-29 DIAGNOSIS — J3089 Other allergic rhinitis: Secondary | ICD-10-CM

## 2020-09-29 DIAGNOSIS — T7800XD Anaphylactic reaction due to unspecified food, subsequent encounter: Secondary | ICD-10-CM | POA: Diagnosis not present

## 2020-09-29 DIAGNOSIS — J339 Nasal polyp, unspecified: Secondary | ICD-10-CM

## 2020-09-29 DIAGNOSIS — J4520 Mild intermittent asthma, uncomplicated: Secondary | ICD-10-CM

## 2020-09-29 DIAGNOSIS — L501 Idiopathic urticaria: Secondary | ICD-10-CM | POA: Diagnosis not present

## 2020-09-29 NOTE — Progress Notes (Signed)
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 Allergic urticaria - well controlled - We will work on getting the Xolair approved again. - Continue with cetirizine two tablets twice daily. - Continue with Singulair daily.   2. Allergic rhinitis/allergic conjunctivitis - with overlying sinusitis - Continue with the nasal steroid spray. - Start the prednisone pack provided. - Call us on Friday with an update. - Try stopping the NSAIDs (because this might be making it worse).  - We will continue with weekly allergy shots (hold off until next week).   3. Mild intermittent asthma  - Continue with Singulair 10mg  daily.  - Continue Proventil 2 puffs every 4 hours as needed for cough or . - The prednisone should help out as well.   4. Return in about 3 months (around 12/30/2020).   Subjective:   Laquiesha Piacente is a 34 y.o. female presenting today for follow up of  Chief Complaint  Patient presents with  . Asthma  . Allergic Reaction    injections causing her to be sick, ST  . Sinus Problem    Sanjna Haskew has a history of the following: Patient Active Problem List   Diagnosis Date Noted  . Mild persistent asthma without complication 09/17/2019  . Seasonal and perennial allergic rhinitis 09/17/2019  . Chronic idiopathic urticaria 09/17/2019  . Seasonal allergic conjunctivitis 09/17/2019    History obtained from: chart review and patient.  Rebecah is a 34 y.o. female presenting for a follow up visit.  She was last seen in May 2021.   At that time, her urticaria seems well controlled with Xolair every 2 weeks as well as cetirizine 20 mg twice daily.  She was also on Singulair daily.  For her allergic rhinitis, she was on Dymista as needed as well as Zaditor eyedrops as needed.  She was having some large local reactions, so we froze her at 0.15 mL as maintenance and remixed her allergen vials to have fewer molds.  We also increased cat and dog to control her symptoms a little bit better.  She did start epinephrine rinses.  For her asthma, we will continue with Singulair and albuterol as needed.  Since the last visit, she was diagnosed with possible mono around 2.5 weeks ago. She had gone out of town and she had Strep and COVID testing that was negative. She did not have flu testing.  She also had a UTI and she received both Macrobid and doxycycline. She did feel somewhat better after the antibiotics. But she still feels fairly congsted with some sinus pain. She is using her cetirizine BID and the Mucinex. She has been more vigilant with the nasal steroid. She has not had fever since 2.5 weeks ago and was up to 100.8. She had body aches and fatigue.   Prior to this she had gotten much better. She did have tonsillitis back in August and she received antibiotics for that. She does not remember when she got them prior to that. This has been more compared to what she was doing.   She did have a large local  reaction last week to the one containing G/W/T/C/D. Prior to this, she had been doing very well with the epinephrine rinses.  Until the past 5 to 6 months, she felt that there were working really well.  She has been using her nasal spray.  Her hives have continued to be an issue.  Evidently, her insurance stopped covering the Xolair.  She is unsure which is due to get that approved again.  She thinks there is something on her part that she needs to do calling the specialty pharmacy or something.  She tells me she has been under a lot of  stress and just has not gotten around to doing this.    .Evidently, her father was diagnosed with leukemia this year and all of the stress related to her being out of work most of 2020 has started to weigh on her.  She tells me that she wakes up, goes to work, and then comes home and goes straight to bed.  This is her escape mechanism.  She does see a therapist that is going to look into seeing someone else because she does not feel that she has gotten anywhere with this current therapist.  She also had to restart therapy for her pelvic floor dysfunction.  She denies any history of a nasal polyp, although on the left I do see what appears to be 1. I have no mention of nasal polyps in my previous notes.  She had a sinus CT in 2018 that did not show any nasal polyps.  She has seen Dr. Suszanne Conners in the past.  Otherwise, there have been no changes to her past medical history, surgical history, family history, or social history.    Review of Systems  Constitutional: Negative.  Negative for chills, fever, malaise/fatigue and weight loss.  HENT: Positive for congestion and sinus pain. Negative for ear discharge and ear pain.   Eyes: Negative for pain, discharge and redness.  Respiratory: Negative for cough, sputum production, shortness of breath and wheezing.   Cardiovascular: Negative.  Negative for chest pain and palpitations.  Gastrointestinal: Negative for abdominal pain, constipation, diarrhea, heartburn, nausea and vomiting.  Skin: Negative.  Negative for itching and rash.  Neurological: Negative for dizziness and headaches.  Endo/Heme/Allergies: Positive for environmental allergies. Does not bruise/bleed easily.       Objective:   Blood pressure 104/60, pulse 92, temperature 98.2 F (36.8 C), temperature source Temporal, resp. rate 18, height 5\' 2"  (1.575 m), weight 228 lb (103.4 kg), SpO2 97 %. Body mass index is 41.7 kg/m.   Physical Exam:  Physical Exam Constitutional:       Appearance: She is well-developed.     Comments: Somewhat sad appearing.  Almost tearful at times.  HENT:     Head: Normocephalic and atraumatic.     Right Ear: Tympanic membrane, ear canal and external ear normal.     Left Ear: Tympanic membrane, ear canal and external ear normal.     Nose: No nasal deformity, septal deviation, mucosal edema or rhinorrhea.     Right Turbinates: Enlarged, swollen and pale.     Left Turbinates: Enlarged, swollen and pale.     Right Sinus: No maxillary sinus tenderness or frontal sinus tenderness.     Left Sinus: No maxillary sinus tenderness or frontal sinus tenderness.     Comments: She does have clear rhinorrhea bilaterally.  There is what appears to be a nasal polyp in the left nasal cavity.    Mouth/Throat:  Mouth: Mucous membranes are not pale and not dry.     Pharynx: Uvula midline.  Eyes:     General:        Right eye: No discharge.        Left eye: No discharge.     Conjunctiva/sclera: Conjunctivae normal.     Right eye: Right conjunctiva is not injected. No chemosis.    Left eye: Left conjunctiva is not injected. No chemosis.    Pupils: Pupils are equal, round, and reactive to light.  Cardiovascular:     Rate and Rhythm: Normal rate and regular rhythm.     Heart sounds: Normal heart sounds.  Pulmonary:     Effort: Pulmonary effort is normal. No tachypnea, accessory muscle usage or respiratory distress.     Breath sounds: Normal breath sounds. No wheezing, rhonchi or rales.     Comments: Moving air well in all lung fields.  No increased work of breathing. Chest:     Chest wall: No tenderness.  Lymphadenopathy:     Cervical: No cervical adenopathy.  Skin:    Coloration: Skin is not pale.     Findings: No abrasion, erythema, petechiae or rash. Rash is not papular, urticarial or vesicular.     Comments: No eczematous or urticarial lesions noted.  Neurological:     Mental Status: She is alert.      Diagnostic studies: none       Malachi Bonds, MD  [facility]

## 2020-09-29 NOTE — Patient Instructions (Addendum)
1. Allergic urticaria - well controlled - We will work on getting the Xolair approved again. - Continue with cetirizine two tablets twice daily. - Continue with Singulair daily.   2. Allergic rhinitis/allergic conjunctivitis - with overlying sinusitis - Continue with the nasal steroid spray. - Start the prednisone pack provided. - Call us on Friday with an update. - Try stopping the NSAIDs (because this might be making it worse).  - We will continue with weekly allergy shots (hold off until next week).   3. Mild intermittent asthma  - Continue with Singulair 10mg  daily.  - Continue Proventil 2 puffs every 4 hours as needed for cough or . - The prednisone should help out as well.   4. Return in about 3 months (around 12/30/2020).    Please inform 02/27/2021 of any Emergency Department visits, hospitalizations, or changes in symptoms. Call us before going to the ED for breathing or allergy symptoms since we might be able to fit you in for a sick visit. Feel free to contact us anytime with any questions, problems, or concerns.  It was a pleasure to see you again today!  Websites that have reliable patient information: 1. American Academy of Asthma, Allergy, and Immunology: www.aaaai.org 2. Food Allergy Research and Education (FARE): foodallergy.org 3. Mothers of Asthmatics: http://www.asthmacommunitynetwork.org 4. American College of Allergy, Asthma, and Immunology: www.acaai.org   COVID-19 Vaccine Information can be found at: [place] For questions related to vaccine distribution or appointments, please email [email or web address] or call [phone].     "Like" [phone] on Facebook and Instagram for our latest updates!       HAPPY SPRING!  Make sure you are registered to vote! If you have moved or changed any of your contact information, you will need to get this updated before voting!  In some cases, you MAY be able  to register to vote online: [place]

## 2020-10-01 ENCOUNTER — Telehealth: Payer: Self-pay

## 2020-10-01 MED ORDER — CLARITHROMYCIN 500 MG PO TABS: 500.0000 mg | ORAL_TABLET | Freq: Two times a day (BID) | ORAL | 0 refills | Status: AC

## 2020-10-01 MED ORDER — CEFDINIR 300 MG PO CAPS: 300.0000 mg | ORAL_CAPSULE | Freq: Two times a day (BID) | ORAL | 0 refills | Status: AC

## 2020-10-01 NOTE — Addendum Note (Signed)
Addended by: Alfonse Spruce on: 10/01/2020 12:15 PM   Modules accepted: Orders

## 2020-10-01 NOTE — Telephone Encounter (Signed)
Patient called back and stated Cefdinir was sent in but she's allergic to Cefdin and there maybe a sensitivity to the medication. Is there any other antibiotic that can be sent in? And it was sent to the wrong pharmacy it should be [facility].

## 2020-10-01 NOTE — Telephone Encounter (Signed)
Patient called to give an update from her visit on Wednesday 09/29/20. Patient is still congested, little shortness of breath, coughing up congestion, sore throat & some blood in her sinuses. Symptoms have improved some but not fully.   Please Advise

## 2020-10-01 NOTE — Addendum Note (Signed)
Addended by: Alfonse Spruce on: 10/01/2020 10:45 AM   Modules accepted: Orders

## 2020-10-01 NOTE — Telephone Encounter (Signed)
I put in cefdinir since this is a third generation cephalosporin and should be well tolerated even if she has reacted to a second generation cephalosporin (cefuroxime).  However, since the pharmacists are insisting on being doctors and just to make the pharmacy happy, we will change to Biaxin 500mg  BID for ten days.   Script sent in. Jennifer [name] will call the pharmacy to cancel the original order for the cefdinir.   , MD [facility]

## 2020-10-01 NOTE — Progress Notes (Signed)
Sent in cefdinir. See telephone note from today.  Malachi Bonds, MD [facility]

## 2020-10-06 ENCOUNTER — Ambulatory Visit (INDEPENDENT_AMBULATORY_CARE_PROVIDER_SITE_OTHER): Payer: Commercial Managed Care - PPO

## 2020-10-06 DIAGNOSIS — J309 Allergic rhinitis, unspecified: Secondary | ICD-10-CM

## 2020-10-20 ENCOUNTER — Encounter: Payer: Self-pay | Admitting: Urology

## 2020-10-20 ENCOUNTER — Ambulatory Visit (INDEPENDENT_AMBULATORY_CARE_PROVIDER_SITE_OTHER): Payer: Commercial Managed Care - PPO | Admitting: Urology

## 2020-10-20 ENCOUNTER — Ambulatory Visit (INDEPENDENT_AMBULATORY_CARE_PROVIDER_SITE_OTHER): Payer: Commercial Managed Care - PPO

## 2020-10-20 ENCOUNTER — Other Ambulatory Visit: Payer: Self-pay

## 2020-10-20 VITALS — BP 122/80 | HR 77 | Temp 97.9°F | Ht 62.0 in | Wt 228.0 lb

## 2020-10-20 DIAGNOSIS — J309 Allergic rhinitis, unspecified: Secondary | ICD-10-CM

## 2020-10-20 DIAGNOSIS — R102 Pelvic and perineal pain: Secondary | ICD-10-CM

## 2020-10-20 DIAGNOSIS — R350 Frequency of micturition: Secondary | ICD-10-CM | POA: Diagnosis not present

## 2020-10-20 LAB — BLADDER SCAN AMB NON-IMAGING: Scan Result: 10

## 2020-10-20 LAB — URINALYSIS, ROUTINE W REFLEX MICROSCOPIC
Bilirubin, UA: NEGATIVE
Glucose, UA: NEGATIVE
Ketones, UA: NEGATIVE
Nitrite, UA: NEGATIVE
Protein,UA: NEGATIVE
RBC, UA: NEGATIVE
Specific Gravity, UA: 1.02 (ref 1.005–1.030)
Urobilinogen, Ur: 0.2 mg/dL (ref 0.2–1.0)
pH, UA: 7 (ref 5.0–7.5)

## 2020-10-20 LAB — MICROSCOPIC EXAMINATION
Epithelial Cells (non renal): >10 /hpf — AB (ref 0–10)
Renal Epithel, UA: NONE SEEN /hpf

## 2020-10-20 NOTE — Progress Notes (Signed)
Urological Symptom Review  Patient is experiencing the following symptoms: Frequent urination Hard to postpone urination   Review of Systems  Gastrointestinal (upper)  : Nausea Indigestion/heartburn  Gastrointestinal (lower) : Diarrhea  Constitutional : Fatigue  Skin: Itching  Eyes: Negative for eye symptoms  Ear/Nose/Throat : Sinus problems  Hematologic/Lymphatic: Easy bruising  Cardiovascular : Negative for cardiovascular symptoms  Respiratory : Negative for respiratory symptoms  Endocrine: Negative for endocrine symptoms  Musculoskeletal: Back pain  Neurological: Headaches  Psychologic: Depression Anxiety

## 2020-10-20 NOTE — Progress Notes (Signed)
10/20/2020 10:01 AM   [address] 1986-08-18 [id number]  Referring provider: Selinda Flavin, MD [address] [place],  [place] [address]  followup pelvic pain  HPI: [name] is a 34yo here for followup for pelvic pain and urinary frequency. She was given mirabegron 25mg  last visit and noted 55% improvement in her urinary urgency and frequency. Rare urge incontinence. She uses the valium 3x per week.   PMH: Past Medical History:  Diagnosis Date  . Anxiety   . Asthma   . Eczema   . GERD (gastroesophageal reflux disease)   . GERD (gastroesophageal reflux disease)   . Thyroid disease   . Thyroid disease   . Urticaria     Surgical History: Past Surgical History:  Procedure Laterality Date  . REPLACEMENT OF URETER     Ureters reimplanted in bladder  . WISDOM TOOTH EXTRACTION  2004    Home Medications:  Allergies as of 10/20/2020      Reactions   Amoxicillin    Cefuroxime Axetil Rash   Penicillins Rash      Medication List       Accurate as of October 20, 2020 10:01 AM. If you have any questions, ask your nurse or doctor.        Alaway 0.025 % ophthalmic solution Generic drug: ketotifen 1 drop 2 (two) times daily.   albuterol 108 (90 Base) MCG/ACT inhaler Commonly known as: VENTOLIN HFA INHALE TWO PUFFS EVERY 4 HOURS AS NEEDED FOR WHEEZING, OR SHORTNESS OF BREATH   azelastine 0.1 % nasal spray Commonly known as: ASTELIN Place 2 sprays into both nostrils 2 (two) times daily.   B COMPLEX-B12 PO Take by mouth.   cetirizine 10 MG tablet Commonly known as: ZYRTEC Take 1 tablet 1-2 times daily as needed.   cholecalciferol 25 MCG (1000 UNIT) tablet Commonly known as: VITAMIN D3 Take 5,000 Units by mouth daily.   clobetasol ointment 0.05 % Commonly known as: TEMOVATE Apply 1 application topically 2 (two) times daily as needed.   diazepam 5 MG tablet Commonly known as: Valium Take 1 tablet (5 mg total) by mouth every 12 (twelve) hours as needed.     EPINEPHrine 0.3 mg/0.3 mL Soaj injection Commonly known as: EPI-PEN INJECT INTO THE MUSCLE ONCE FOR 1 DOSE   famotidine 20 MG tablet Commonly known as: Pepcid Take one tablet 1-2 times daily as needed.   Flovent HFA 110 MCG/ACT inhaler Generic drug: fluticasone Inhale 2 puffs into the lungs 2 (two) times daily. For 2 weeks during asthma flare.   fluconazole 100 MG tablet Commonly known as: DIFLUCAN Take 100 mg by mouth daily.   fluticasone 50 MCG/ACT nasal spray Commonly known as: FLONASE Place 2 sprays into both nostrils daily as needed for allergies or rhinitis.   folic acid 1 MG tablet Commonly known as: FOLVITE Take 1 mg by mouth daily.   GOODY HEADACHE PO Take by mouth.   ibuprofen 800 MG tablet Commonly known as: ADVIL Take 1 tablet (800 mg total) by mouth every 8 (eight) hours as needed.   levothyroxine 88 MCG tablet Commonly known as: SYNTHROID 1 tablet from Monday to Saturday. Take only 2 tablet every Sunday.   Lo Loestrin Fe 1 MG-10 MCG / 10 MCG tablet Generic drug: Norethindrone-Ethinyl Estradiol-Fe Biphas TK 1 T PO QD   Magnesium 250 MG Tabs Take by mouth.   mirabegron ER 50 MG Tb24 tablet Commonly known as: MYRBETRIQ Take 1 tablet (50 mg total) by mouth daily.  montelukast 10 MG tablet Commonly known as: SINGULAIR TAKE 1 TABLET BY MOUTH AT BEDTIME   PROBIOTIC DAILY PO Take by mouth.   sertraline 100 MG tablet Commonly known as: ZOLOFT sertraline 100 mg tablet  TAKE 1 TABLET BY MOUTH EVERY DAY   triamcinolone 0.025 % ointment Commonly known as: KENALOG Apply 1 application topically 2 (two) times daily.   Xolair 150 MG injection Generic drug: omalizumab Inject 300 mg into the skin every 14 (fourteen) days.       Allergies:  Allergies  Allergen Reactions  . Amoxicillin   . Cefuroxime Axetil Rash  . Penicillins Rash    Family History: Family History  Problem Relation Age of Onset  . Asthma Mother   . Urticaria Mother   .  Allergic rhinitis Mother   . Asthma Brother   . Eczema Brother   . Allergic rhinitis Brother   . Osteoarthritis Father     Social History:  reports that she has never smoked. She has never used smokeless tobacco. She reports that she does not drink alcohol and does not use drugs.  ROS: All other review of systems were reviewed and are negative except what is noted above in HPI  Physical Exam: BP 122/80   Pulse 77   Temp 97.9 F (36.6 C)   Ht 5\' 2"  (1.575 m)   Wt 228 lb (103.4 kg)   BMI 41.70 kg/m   Constitutional:  Alert and oriented, No acute distress. HEENT: [place] AT, moist mucus membranes.  Trachea midline, no masses. Cardiovascular: No clubbing, cyanosis, or edema. Respiratory: Normal respiratory effort, no increased work of breathing. GI: Abdomen is soft, nontender, nondistended, no abdominal masses GU: No CVA tenderness.  Lymph: No cervical or inguinal lymphadenopathy. Skin: No rashes, bruises or suspicious lesions. Neurologic: Grossly intact, no focal deficits, moving all 4 extremities. Psychiatric: Normal mood and affect.  Laboratory Data: Lab Results  Component Value Date   WBC 8.1 09/04/2018   HGB 12.5 09/04/2018   HCT 37.4 09/04/2018   MCV 91 09/04/2018   PLT 448 09/04/2018    Lab Results  Component Value Date   CREATININE 0.83 09/04/2018    No results found for: PSA  No results found for: TESTOSTERONE  No results found for: HGBA1C  Urinalysis    Component Value Date/Time   APPEARANCEUR Cloudy (A) 09/15/2020 0927   GLUCOSEU Negative 09/15/2020 0927   BILIRUBINUR Negative 09/15/2020 0927   PROTEINUR Negative 09/15/2020 0927   NITRITE Negative 09/15/2020 0927   LEUKOCYTESUR Trace (A) 09/15/2020 0927    Lab Results  Component Value Date   LABMICR See below: 09/15/2020   WBCUA 6-10 (A) 09/15/2020   LABEPIT 0-10 09/15/2020   BACTERIA Many (A) 09/15/2020    Pertinent Imaging:  No results found for this or any previous visit.  No results  found for this or any previous visit.  No results found for this or any previous visit.  No results found for this or any previous visit.  No results found for this or any previous visit.  No results found for this or any previous visit.  No results found for this or any previous visit.  No results found for this or any previous visit.   Assessment & Plan:    1. Urinary frequency -We will increase mirabegron to 50mg . She will call in 1 month ifshe desires rx - Urinalysis, Routine w reflex microscopic - Bladder Scan (Post Void Residual) in office  2. Pelvic pain -Valium 5mg  prn  No follow-ups on file.  Nicolette Bang, MD  [facility] [place]

## 2020-10-20 NOTE — Patient Instructions (Signed)

## 2020-10-21 DIAGNOSIS — J3089 Other allergic rhinitis: Secondary | ICD-10-CM | POA: Diagnosis not present

## 2020-10-21 NOTE — Progress Notes (Signed)
Vials exp 10-21-21

## 2020-11-03 ENCOUNTER — Ambulatory Visit (INDEPENDENT_AMBULATORY_CARE_PROVIDER_SITE_OTHER): Payer: Commercial Managed Care - PPO

## 2020-11-03 DIAGNOSIS — J309 Allergic rhinitis, unspecified: Secondary | ICD-10-CM

## 2020-11-17 ENCOUNTER — Ambulatory Visit (INDEPENDENT_AMBULATORY_CARE_PROVIDER_SITE_OTHER): Payer: Commercial Managed Care - PPO

## 2020-11-17 DIAGNOSIS — J309 Allergic rhinitis, unspecified: Secondary | ICD-10-CM

## 2020-11-18 NOTE — Telephone Encounter (Signed)
Hey Jennifer [name],  Could you help me out with the patient request below for an itemized bill?  Thanks

## 2020-11-24 ENCOUNTER — Ambulatory Visit (INDEPENDENT_AMBULATORY_CARE_PROVIDER_SITE_OTHER): Payer: Commercial Managed Care - PPO

## 2020-11-24 DIAGNOSIS — J309 Allergic rhinitis, unspecified: Secondary | ICD-10-CM | POA: Diagnosis not present

## 2020-12-15 ENCOUNTER — Ambulatory Visit (INDEPENDENT_AMBULATORY_CARE_PROVIDER_SITE_OTHER): Payer: Commercial Managed Care - PPO

## 2020-12-15 DIAGNOSIS — J309 Allergic rhinitis, unspecified: Secondary | ICD-10-CM | POA: Diagnosis not present

## 2020-12-22 ENCOUNTER — Ambulatory Visit (INDEPENDENT_AMBULATORY_CARE_PROVIDER_SITE_OTHER): Payer: Commercial Managed Care - PPO

## 2020-12-22 DIAGNOSIS — J309 Allergic rhinitis, unspecified: Secondary | ICD-10-CM

## 2020-12-29 ENCOUNTER — Ambulatory Visit (INDEPENDENT_AMBULATORY_CARE_PROVIDER_SITE_OTHER): Payer: Commercial Managed Care - PPO

## 2020-12-29 DIAGNOSIS — J309 Allergic rhinitis, unspecified: Secondary | ICD-10-CM | POA: Diagnosis not present

## 2021-01-05 ENCOUNTER — Encounter: Payer: Self-pay | Admitting: Allergy & Immunology

## 2021-01-05 ENCOUNTER — Ambulatory Visit: Payer: Self-pay

## 2021-01-05 ENCOUNTER — Ambulatory Visit (INDEPENDENT_AMBULATORY_CARE_PROVIDER_SITE_OTHER): Payer: Commercial Managed Care - PPO | Admitting: Allergy & Immunology

## 2021-01-05 ENCOUNTER — Other Ambulatory Visit: Payer: Self-pay

## 2021-01-05 VITALS — BP 102/62 | HR 71 | Temp 98.7°F | Resp 18

## 2021-01-05 DIAGNOSIS — L501 Idiopathic urticaria: Secondary | ICD-10-CM | POA: Diagnosis not present

## 2021-01-05 DIAGNOSIS — J302 Other seasonal allergic rhinitis: Secondary | ICD-10-CM

## 2021-01-05 DIAGNOSIS — J4520 Mild intermittent asthma, uncomplicated: Secondary | ICD-10-CM

## 2021-01-05 DIAGNOSIS — J309 Allergic rhinitis, unspecified: Secondary | ICD-10-CM

## 2021-01-05 DIAGNOSIS — T7800XD Anaphylactic reaction due to unspecified food, subsequent encounter: Secondary | ICD-10-CM

## 2021-01-05 DIAGNOSIS — J3089 Other allergic rhinitis: Secondary | ICD-10-CM

## 2021-01-05 DIAGNOSIS — B999 Unspecified infectious disease: Secondary | ICD-10-CM | POA: Diagnosis not present

## 2021-01-05 DIAGNOSIS — J339 Nasal polyp, unspecified: Secondary | ICD-10-CM

## 2021-01-05 MED ORDER — CLARITHROMYCIN 500 MG PO TABS: 500.0000 mg | ORAL_TABLET | Freq: Two times a day (BID) | ORAL | 0 refills | Status: AC

## 2021-01-05 NOTE — Patient Instructions (Addendum)
1. Allergic urticaria - well controlled - We will holding off on Xolair.  - Continue with cetirizine two tablets twice daily. - Continue with Singulair daily.    2. Allergic rhinitis/allergic conjunctivitis - Continue with the nasal steroid spray - Continue with the allergy shots.  - Start clarithromycin 500mg  twice daily for one week. - Hopefully this will help with the ongoing sinusitis.   3. Mild intermittent asthma  - Continue with Singulair 10mg  daily.  - Continue Proventil 2 puffs every 4 hours as needed for cough.   4. Recurrent infections - Get the labs ordered today. - Hopefully this will all be negative.   5. Return in about 6 months (around 07/05/2021).    Please inform of any Emergency Department visits, hospitalizations, or changes in symptoms. Call 07/07/2021 before going to the ED for breathing or allergy symptoms since we might be able to fit you in for a sick visit. Feel free to contact us anytime with any questions, problems, or concerns.  It was a pleasure to see you again today!  Websites that have reliable patient information: 1. American Academy of Asthma, Allergy, and Immunology: www.aaaai.org 2. Food Allergy Research and Education (FARE): foodallergy.org 3. Mothers of Asthmatics: http://www.asthmacommunitynetwork.org 4. American College of Allergy, Asthma, and Immunology: www.acaai.org   COVID-19 Vaccine Information can be found at: [place] For questions related to vaccine distribution or appointments, please email [email or web address] or call [phone].   We realize that you might be concerned about having an allergic reaction to the COVID19 vaccines. To help with that concern, WE ARE OFFERING THE COVID19 VACCINES IN OUR OFFICE! Ask the front desk for dates!     "Like" [email or web address] on Facebook and Instagram for our latest updates!      A healthy democracy works best when [phone]  participate! Make sure you are registered to vote! If you have moved or changed any of your contact information, you will need to get this updated before voting!  In some cases, you MAY be able to register to vote online: [place]

## 2021-01-05 NOTE — Progress Notes (Signed)
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 Allergic urticaria - well controlled - We will holding off on Xolair.  - Continue with cetirizine two tablets twice daily. - Continue with Singulair daily.    2. Allergic rhinitis/allergic conjunctivitis - Continue with the nasal steroid spray - Continue with the allergy shots.  - Start clarithromycin 579m twice daily for one week. - Hopefully this will help with the ongoing sinusitis.   3. Mild intermittent asthma  - Continue with Singulair 144mdaily.  - Continue Proventil 2 puffs every 4 hours as needed for cough.   4. Recurrent infections - Get the labs ordered today. - Hopefully this will all be negative.   5. Return in about 6 months (around 07/05/2021).   Subjective:   [name] a [age].o. female presenting today for follow up of  Chief Complaint  Patient presents with  . Asthma    AnFeliza Divenas a history of the following: Patient Active Problem List   Diagnosis Date Noted  . Pelvic pain in female 10/20/2020  . Urinary frequency 10/20/2020  . Mild persistent asthma without complication 1062/86/3817. Seasonal and perennial allergic rhinitis 09/17/2019  . Chronic idiopathic urticaria 09/17/2019  . Seasonal allergic conjunctivitis 09/17/2019    History obtained from: chart review and patient.  [name] a [age].o. female presenting for a follow up visit.  She  was last seen in November 2021.  At that time, we were working on getting Xolair approved again.  We continue with cetirizine 2 tablets twice daily as well as Singulair 10 mg daily.  For her allergic rhinoconjunctivitis, we started her on prednisone.  I recommended stopping NSAIDs to see if this might be making her allergic rhinitis symptoms and congestion worse.  Continue with her allergen immunotherapy.  For her asthma, we continued with Singulair as well as Proventil.  We did not have her on a daily controller medication.  Since the last visit, she has mostly done well.   Asthma/Respiratory Symptom History: She had COVID in January 2022. She also had adenovrisus at the same. She has had a chronic runny nose since that time. She was placed on prednisone for five days. She did not have to go to the hospital. She did a whole panel of testing through work. She was out of work for one week (she is NOT vaccinated). She had to use her PAL time.   Allergic Rhinitis Symptom History: She has been using her nose spray. She has been coming weekly for the past month. She had to cut back. She had to go back to every two weeks after next week.   Food Allergy Symptom History: She does not have any kind of food allergies. She does have a new diet, however.. She does not eat beef. She is not eating pork.   Eczema Symptom History: Itching is better since starting this new gluten and dairy free diet.  This is a $5,000 bill that was sent for collections for Xolair.  Tammy is working on this. Hives overall and the itching are better with the wheat and dairy avoidance.   She has a religious exemption for the vaccine. She injured her left ankle with some cleaning and has a brace in place.   She has been sick a lot. She has had a lot of issues with her tonsils this year. She had tonsillitis and another infection. She takes a lot of supplements. She has never had an immune workup in the past.   She is seeing an RD ([place]  female who she met on Instagram with intermittent fasting and doing some dietary changes). She sees Dr. Posey Pronto in [facility] for management of her hypothyroidism.   Otherwise, there have been no changes to her past medical history, surgical history, family history, or social history.    Review of Systems  Constitutional: Negative.  Negative for chills, fever, malaise/fatigue and weight loss.  HENT: Positive for congestion and sinus pain. Negative for ear discharge and ear pain.   Eyes: Negative for pain, discharge and redness.  Respiratory: Negative for cough, sputum production, shortness of breath and wheezing.   Cardiovascular: Negative.  Negative for chest pain and palpitations.  Gastrointestinal: Negative for abdominal pain, constipation, diarrhea, heartburn, nausea and vomiting.  Skin: Negative.  Negative for itching and rash.  Neurological: Negative for dizziness and headaches.  Endo/Heme/Allergies: Positive for environmental allergies. Does not bruise/bleed easily.       Objective:   Blood pressure 102/62, pulse 71, temperature 98.7 F (37.1 C), temperature source Temporal, resp. rate 18, SpO2 98 %. There is no height or weight on file to calculate BMI.   Physical Exam:  Physical Exam Constitutional:      Appearance: She is well-developed.     Comments: Bright red fair.  HENT:     Head: Normocephalic and atraumatic.     Right Ear: Tympanic membrane, ear canal and external ear normal.     Left Ear: Tympanic membrane, ear canal and external ear normal.     Nose: No nasal deformity, septal deviation, mucosal edema, rhinorrhea or epistaxis.     Right Turbinates: Enlarged and swollen.     Left Turbinates: Enlarged and swollen.     Right Sinus: No maxillary sinus tenderness or frontal sinus tenderness.     Left Sinus: No maxillary sinus tenderness or frontal sinus tenderness.     Mouth/Throat:     Lips: Pink.     Mouth: Oropharynx is clear and moist. Mucous membranes are  moist. Mucous membranes are pale and not dry.     Pharynx: Uvula midline.  Eyes:     General:        Right eye: No discharge.        Left eye: No discharge.     Extraocular Movements: EOM normal.     Conjunctiva/sclera: Conjunctivae normal.     Right eye: Right conjunctiva is not injected. No chemosis.    Left eye: Left conjunctiva is not injected. No chemosis.    Pupils: Pupils are equal, round, and reactive to light.  Cardiovascular:     Rate and Rhythm: Normal rate and regular rhythm.     Heart sounds: Normal heart sounds.  Pulmonary:     Effort: Pulmonary effort is normal. No tachypnea, accessory muscle usage or respiratory distress.     Breath sounds: Normal breath sounds. No wheezing, rhonchi or rales.     Comments: No wheezing or crackles noted.  Chest:     Chest wall:  No tenderness.  Lymphadenopathy:     Cervical: No cervical adenopathy.  Skin:    General: Skin is warm.     Capillary Refill: Capillary refill takes less than 2 seconds.     Coloration: Skin is not pale.     Findings: No abrasion, erythema, petechiae or rash. Rash is not papular, urticarial or vesicular.     Comments: No eczematous or urticarial lesions noted.   Neurological:     Mental Status: She is alert.  Psychiatric:        Mood and Affect: Mood and affect normal.        Behavior: Behavior is cooperative.       Diagnostic studies: none     Salvatore Marvel, MD  [facility]

## 2021-01-12 ENCOUNTER — Ambulatory Visit (INDEPENDENT_AMBULATORY_CARE_PROVIDER_SITE_OTHER): Payer: Commercial Managed Care - PPO

## 2021-01-12 DIAGNOSIS — J309 Allergic rhinitis, unspecified: Secondary | ICD-10-CM | POA: Diagnosis not present

## 2021-01-17 LAB — COMPLEMENT, TOTAL: Compl, Total (CH50): >60 U/mL (ref >41)

## 2021-01-17 LAB — STREP PNEUMONIAE 23 SEROTYPES IGG
Pneumo Ab Type 1*: 0.5 ug/mL — ABNORMAL LOW (ref >1.3)
Pneumo Ab Type 12 (12F)*: 0.1 ug/mL — ABNORMAL LOW (ref >1.3)
Pneumo Ab Type 14*: 17 ug/mL (ref >1.3)
Pneumo Ab Type 17 (17F)*: 0.2 ug/mL — ABNORMAL LOW (ref >1.3)
Pneumo Ab Type 19 (19F)*: 1.7 ug/mL (ref >1.3)
Pneumo Ab Type 2*: 1 ug/mL — ABNORMAL LOW (ref >1.3)
Pneumo Ab Type 20*: 2 ug/mL (ref >1.3)
Pneumo Ab Type 22 (22F)*: 0.3 ug/mL — ABNORMAL LOW (ref >1.3)
Pneumo Ab Type 23 (23F)*: 0.1 ug/mL — ABNORMAL LOW (ref >1.3)
Pneumo Ab Type 26 (6B)*: 1.1 ug/mL — ABNORMAL LOW (ref >1.3)
Pneumo Ab Type 3*: 2 ug/mL (ref >1.3)
Pneumo Ab Type 34 (10A)*: 0.5 ug/mL — ABNORMAL LOW (ref >1.3)
Pneumo Ab Type 4*: 0.6 ug/mL — ABNORMAL LOW (ref >1.3)
Pneumo Ab Type 43 (11A)*: 0.1 ug/mL — ABNORMAL LOW (ref >1.3)
Pneumo Ab Type 5*: 0.9 ug/mL — ABNORMAL LOW (ref >1.3)
Pneumo Ab Type 51 (7F)*: 0.2 ug/mL — ABNORMAL LOW (ref >1.3)
Pneumo Ab Type 54 (15B)*: >35.4 ug/mL (ref >1.3)
Pneumo Ab Type 56 (18C)*: <0.1 ug/mL — ABNORMAL LOW (ref >1.3)
Pneumo Ab Type 57 (19A)*: 2.2 ug/mL (ref >1.3)
Pneumo Ab Type 68 (9V)*: 0.3 ug/mL — ABNORMAL LOW (ref >1.3)
Pneumo Ab Type 70 (33F)*: 1 ug/mL — ABNORMAL LOW (ref >1.3)
Pneumo Ab Type 8*: 0.3 ug/mL — ABNORMAL LOW (ref >1.3)
Pneumo Ab Type 9 (9N)*: <0.1 ug/mL — ABNORMAL LOW (ref >1.3)

## 2021-01-17 LAB — CBC WITH DIFFERENTIAL
Basophils Absolute: 0 10*3/uL (ref 0.0–0.2)
Basos: 1 %
EOS (ABSOLUTE): 0.2 10*3/uL (ref 0.0–0.4)
Eos: 3 %
Hematocrit: 40 % (ref 34.0–46.6)
Hemoglobin: 13.3 g/dL (ref 11.1–15.9)
Immature Grans (Abs): 0 10*3/uL (ref 0.0–0.1)
Immature Granulocytes: 0 %
Lymphocytes Absolute: 2.6 10*3/uL (ref 0.7–3.1)
Lymphs: 34 %
MCH: 30.3 pg (ref 26.6–33.0)
MCHC: 33.3 g/dL (ref 31.5–35.7)
MCV: 91 fL (ref 79–97)
Monocytes Absolute: 0.8 10*3/uL (ref 0.1–0.9)
Monocytes: 11 %
Neutrophils Absolute: 3.9 10*3/uL (ref 1.4–7.0)
Neutrophils: 51 %
RBC: 4.39 x10E6/uL (ref 3.77–5.28)
RDW: 13.6 % (ref 11.7–15.4)
WBC: 7.6 10*3/uL (ref 3.4–10.8)

## 2021-01-17 LAB — DIPHTHERIA / TETANUS ANTIBODY PANEL
Diphtheria Ab: <0.1 IU/mL — ABNORMAL LOW (ref <0.10)
Tetanus Ab, IgG: 0.45 IU/mL (ref <0.10)

## 2021-01-17 LAB — IGG, IGA, IGM
IgA/Immunoglobulin A, Serum: 243 mg/dL (ref 87–352)
IgG (Immunoglobin G), Serum: 1067 mg/dL (ref 586–1602)
IgM (Immunoglobulin M), Srm: 83 mg/dL (ref 26–217)

## 2021-01-19 ENCOUNTER — Ambulatory Visit (INDEPENDENT_AMBULATORY_CARE_PROVIDER_SITE_OTHER): Payer: Commercial Managed Care - PPO | Admitting: Urology

## 2021-01-19 ENCOUNTER — Encounter: Payer: Self-pay | Admitting: Urology

## 2021-01-19 ENCOUNTER — Other Ambulatory Visit: Payer: Self-pay

## 2021-01-19 VITALS — BP 116/57 | HR 69 | Temp 98.2°F | Ht 62.0 in | Wt 205.0 lb

## 2021-01-19 DIAGNOSIS — R350 Frequency of micturition: Secondary | ICD-10-CM

## 2021-01-19 DIAGNOSIS — R102 Pelvic and perineal pain: Secondary | ICD-10-CM | POA: Diagnosis not present

## 2021-01-19 LAB — URINALYSIS, ROUTINE W REFLEX MICROSCOPIC
Bilirubin, UA: NEGATIVE
Glucose, UA: NEGATIVE
Leukocytes,UA: NEGATIVE
Nitrite, UA: NEGATIVE
Protein,UA: NEGATIVE
RBC, UA: NEGATIVE
Specific Gravity, UA: 1.02 (ref 1.005–1.030)
Urobilinogen, Ur: 0.2 mg/dL (ref 0.2–1.0)
pH, UA: 5.5 (ref 5.0–7.5)

## 2021-01-19 MED ORDER — MIRABEGRON ER 50 MG PO TB24: 50.0000 mg | ORAL_TABLET | Freq: Every day | ORAL | 11 refills | Status: DC

## 2021-01-19 MED ORDER — DIAZEPAM 5 MG PO TABS: 5.0000 mg | ORAL_TABLET | Freq: Two times a day (BID) | ORAL | 3 refills | Status: DC | PRN

## 2021-01-19 NOTE — Progress Notes (Signed)
Urological Symptom Review  Patient is experiencing the following symptoms: Frequent urination Burning/pain with urination   Review of Systems  Gastrointestinal (upper)  : Nausea Indigestion/heartburn  Gastrointestinal (lower) : Negative for lower GI symptoms  Constitutional : Fatigue  Skin: Itching  Eyes: Negative for eye symptoms  Ear/Nose/Throat : Sinus problems  Hematologic/Lymphatic: Negative for Hematologic/Lymphatic symptoms  Cardiovascular : Negative for cardiovascular symptoms  Respiratory : Negative for respiratory symptoms  Endocrine: Negative for endocrine symptoms  Musculoskeletal: Back pain  Neurological: Headaches  Psychologic: Depression Anxiety

## 2021-01-19 NOTE — Progress Notes (Signed)
01/19/2021 9:55 AM   Jennifer [name] 1986-04-15 [id number]  Referring provider: Selinda Flavin, MD [address] [place],  [place] [address]  followup pelvic pain and urinary frequency  HPI: Jennifer [name] is a 35yo here for followup for urinary frequency and pelvic pain. She was doing well until [facility] in late December and due to the stress her pelvic pain and urinary frequency increased. She is currently on mirabegron 50mg  daily. She was taking valium prn but stopped it 1 month.    PMH: Past Medical History:  Diagnosis Date  . Anxiety   . Asthma   . Eczema   . GERD (gastroesophageal reflux disease)   . GERD (gastroesophageal reflux disease)   . Thyroid disease   . Thyroid disease   . Urticaria     Surgical History: Past Surgical History:  Procedure Laterality Date  . REPLACEMENT OF URETER     Ureters reimplanted in bladder  . WISDOM TOOTH EXTRACTION  2004    Home Medications:  Allergies as of 01/19/2021      Reactions   Amoxicillin    Cefuroxime Axetil Rash   Penicillins Rash      Medication List       Accurate as of January 19, 2021  9:55 AM. If you have any questions, ask your nurse or doctor.        albuterol 108 (90 Base) MCG/ACT inhaler Commonly known as: VENTOLIN HFA INHALE TWO PUFFS EVERY 4 HOURS AS NEEDED FOR WHEEZING, OR SHORTNESS OF BREATH   azelastine 0.1 % nasal spray Commonly known as: ASTELIN Place 2 sprays into both nostrils 2 (two) times daily.   B COMPLEX-B12 PO Take by mouth.   cetirizine 10 MG tablet Commonly known as: ZYRTEC Take 1 tablet 1-2 times daily as needed.   cholecalciferol 25 MCG (1000 UNIT) tablet Commonly known as: VITAMIN D3 Take 5,000 Units by mouth daily.   clobetasol ointment 0.05 % Commonly known as: TEMOVATE Apply 1 application topically 2 (two) times daily as needed.   diazepam 5 MG tablet Commonly known as: Valium Take 1 tablet (5 mg total) by mouth every 12 (twelve) hours as needed.   EPINEPHrine 0.3 mg/0.3 mL Soaj  injection Commonly known as: EPI-PEN INJECT INTO THE MUSCLE ONCE FOR 1 DOSE   famotidine 20 MG tablet Commonly known as: Pepcid Take one tablet 1-2 times daily as needed.   Flovent HFA 110 MCG/ACT inhaler Generic drug: fluticasone Inhale 2 puffs into the lungs 2 (two) times daily. For 2 weeks during asthma flare.   fluticasone 50 MCG/ACT nasal spray Commonly known as: FLONASE Place 2 sprays into both nostrils daily as needed for allergies or rhinitis.   folic acid 1 MG tablet Commonly known as: FOLVITE Take 1 mg by mouth daily.   GOODY HEADACHE PO Take by mouth.   ibuprofen 800 MG tablet Commonly known as: ADVIL Take 1 tablet (800 mg total) by mouth every 8 (eight) hours as needed.   ketotifen 0.025 % ophthalmic solution Commonly known as: ZADITOR 1 drop 2 (two) times daily.   levothyroxine 88 MCG tablet Commonly known as: SYNTHROID 1 tablet from Monday to Saturday. Take only 2 tablet every Sunday.   Lo Loestrin Fe 1 MG-10 MCG / 10 MCG tablet Generic drug: Norethindrone-Ethinyl Estradiol-Fe Biphas TK 1 T PO QD   Magnesium 250 MG Tabs Take by mouth.   meloxicam 7.5 MG tablet Commonly known as: MOBIC TAKE 1 TABLET BY MOUTH TWICE DAILY FOR TWO WEEKS AND  THEN AS NEEDED   mirabegron ER 50 MG Tb24 tablet Commonly known as: MYRBETRIQ Take 1 tablet (50 mg total) by mouth daily.   montelukast 10 MG tablet Commonly known as: SINGULAIR TAKE 1 TABLET BY MOUTH AT BEDTIME   OVER THE COUNTER MEDICATION Thyro smart   OVER THE COUNTER MEDICATION Adreno smart   OVER THE COUNTER MEDICATION Digestive enzymes   PROBIOTIC DAILY PO Take by mouth.   sertraline 100 MG tablet Commonly known as: ZOLOFT sertraline 100 mg tablet  TAKE 1 TABLET BY MOUTH EVERY DAY   triamcinolone 0.025 % ointment Commonly known as: KENALOG Apply 1 application topically 2 (two) times daily.       Allergies:  Allergies  Allergen Reactions  . Amoxicillin   . Cefuroxime Axetil Rash  .  Penicillins Rash    Family History: Family History  Problem Relation Age of Onset  . Asthma Mother   . Urticaria Mother   . Allergic rhinitis Mother   . Asthma Brother   . Eczema Brother   . Allergic rhinitis Brother   . Osteoarthritis Father     Social History:  reports that she has never smoked. She has never used smokeless tobacco. She reports that she does not drink alcohol and does not use drugs.  ROS: All other review of systems were reviewed and are negative except what is noted above in HPI  Physical Exam: BP (!) 116/57   Pulse 69   Temp 98.2 F (36.8 C)   Ht 5\' 2"  (1.575 m)   Wt 205 lb (93 kg)   BMI 37.49 kg/m   Constitutional:  Alert and oriented, No acute distress. HEENT: [place] AT, moist mucus membranes.  Trachea midline, no masses. Cardiovascular: No clubbing, cyanosis, or edema. Respiratory: Normal respiratory effort, no increased work of breathing. GI: Abdomen is soft, nontender, nondistended, no abdominal masses GU: No CVA tenderness.  Lymph: No cervical or inguinal lymphadenopathy. Skin: No rashes, bruises or suspicious lesions. Neurologic: Grossly intact, no focal deficits, moving all 4 extremities. Psychiatric: Normal mood and affect.  Laboratory Data: Lab Results  Component Value Date   WBC 7.6 01/05/2021   HGB 13.3 01/05/2021   HCT 40.0 01/05/2021   MCV 91 01/05/2021   PLT 448 09/04/2018    Lab Results  Component Value Date   CREATININE 0.83 09/04/2018    No results found for: PSA  No results found for: TESTOSTERONE  No results found for: HGBA1C  Urinalysis    Component Value Date/Time   APPEARANCEUR Clear 10/20/2020 0929   GLUCOSEU Negative 10/20/2020 0929   BILIRUBINUR Negative 10/20/2020 0929   PROTEINUR Negative 10/20/2020 0929   NITRITE Negative 10/20/2020 0929   LEUKOCYTESUR Trace (A) 10/20/2020 0929    Lab Results  Component Value Date   LABMICR See below: 10/20/2020   WBCUA 11-30 (A) 10/20/2020   LABEPIT >10 (A)  10/20/2020   BACTERIA Moderate (A) 10/20/2020    Pertinent Imaging:  No results found for this or any previous visit.  No results found for this or any previous visit.  No results found for this or any previous visit.  No results found for this or any previous visit.  No results found for this or any previous visit.  No results found for this or any previous visit.  No results found for this or any previous visit.  No results found for this or any previous visit.   Assessment & Plan:    1. Urinary frequency -continue mirabegron 50mg   -  Urinalysis, Routine w reflex microscopic  2. Pelvic pain in female -valium 5mg  prn   No follow-ups on file.  , MD  [facility] [place]

## 2021-01-19 NOTE — Patient Instructions (Signed)
Pelvic Floor Dysfunction  Pelvic floor dysfunction (PFD) is a condition that results when the group of muscles and connective tissues that support the organs in the pelvis (pelvic floor muscles) do not work well. These muscles and their connections form a sling that supports the colon and bladder. In men, these muscles also support the prostate gland. In women, they also support the uterus. PFD causes pelvic floor muscles to be too weak, too tight, or a combination of both. In PFD, muscle movements are not coordinated. This condition may cause bowel or bladder problems. It may also cause pain. What are the causes? This condition may be caused by an injury to the pelvic area or by a weakening of pelvic muscles. This often results from pregnancy and childbirth or other types of strain. In many cases, the exact cause is not known. What increases the risk? The following factors may make you more likely to develop this condition:  Having a condition of chronic bladder tissue inflammation (interstitial cystitis).  Being an older person.  Being overweight.  Radiation treatment for cancer in the pelvic region.  Previous pelvic surgery, such as removal of the uterus (hysterectomy) or prostate gland (prostatectomy). What are the signs or symptoms? Symptoms of this condition vary and may include:  Bladder symptoms, such as: ? Trouble starting urination and emptying the bladder. ? Frequent urinary tract infections. ? Leaking urine when coughing, laughing, or exercising (stress incontinence). ? Having to pass urine urgently or frequently. ? Pain when passing urine.  Bowel symptoms, such as: ? Constipation. ? Urgent or frequent bowel movements. ? Incomplete bowel movements. ? Painful bowel movements. ? Leaking stool or gas.  Unexplained genital or rectal pain.  Genital or rectal muscle spasms.  Low back pain. In women, symptoms of PFD may also include:  A heavy, full, or aching feeling in  the vagina.  A bulge that protrudes into the vagina.  Pain during or after sexual intercourse. How is this diagnosed? This condition may be diagnosed based on:  Your symptoms and medical history.  A physical exam. During the exam, your health care provider may check your pelvic muscles for tightness, spasm, pain, or weakness. This may include a rectal exam and a pelvic exam for women. In some cases, you may have diagnostic tests, such as:  Electrical muscle function tests.  Urine flow testing.  X-ray tests of bowel function.  Ultrasound of the pelvic organs. How is this treated? Treatment for this condition depends on your symptoms. Treatment options include:  Physical therapy. This may include Kegel exercises to help relax or strengthen the pelvic floor muscles.  Biofeedback. This type of therapy provides feedback on how tight your pelvic floor muscles are so that you can learn to control them.  Internal or external massage therapy.  A treatment that involves electrical stimulation of the pelvic floor muscles to help control pain (transcutaneous electrical nerve stimulation, or TENS).  Sound wave therapy (ultrasound) to reduce muscle spasms.  Medicines, such as: ? Muscle relaxants. ? Bladder control medicines. Surgery to reconstruct or support pelvic floor muscles may be an option if other treatments do not help. Follow these instructions at home: Activity  Do your usual activities as told by your health care provider. Ask your health care provider if you should modify any activities.  Do pelvic floor strengthening or relaxing exercises at home as told by your physical therapist. Lifestyle  Maintain a healthy weight.  Eat foods that are high in fiber, such as   beans, whole grains, and fresh fruits and vegetables.  Limit foods that are high in fat and processed sugars, such as fried or sweet foods.  Manage stress with relaxation techniques such as yoga or  meditation. General instructions  If you have problems with leakage: ? Use absorbable pads or wear padded underwear. ? Wash frequently with mild soap. ? Keep your genital and anal area as clean and dry as possible. ? Ask your health care provider if you should try a barrier cream to prevent skin irritation.  Take warm baths to relieve pelvic muscle tension or spasms.  Take over-the-counter and prescription medicines only as told by your health care provider.  Keep all follow-up visits as told by your health care provider. This is important. Contact a health care provider if you:  Are not improving with home care.  Have signs or symptoms of PFD that get worse at home.  Develop new signs or symptoms at home.  Have signs of a urinary tract infection, such as: ? Fever. ? Chills. ? Urinary frequency. ? A burning feeling when urinating.  Have not had a bowel movement in 3 days (constipation). Summary  Pelvic floor dysfunction results when the muscles and connective tissues in your pelvic floor do not work well.  These muscles and their connections form a sling that supports your colon and bladder. In men, these muscles also support the prostate gland. In women, they also support the uterus.  PFD may be caused by an injury to the pelvic area or by a weakening of pelvic muscles.  PFD causes pelvic floor muscles to be too weak, too tight, or a combination of both. Symptoms may vary from person to person.  In most cases, PFD can be treated with physical therapies and medicines. Surgery may be an option if other treatments do not help. This information is not intended to replace advice given to you by your health care provider. Make sure you discuss any questions you have with your health care provider. Document Revised: 05/27/2018 Document Reviewed: 05/27/2018 Elsevier Patient Education  2021 Elsevier Inc.  

## 2021-01-26 ENCOUNTER — Ambulatory Visit (INDEPENDENT_AMBULATORY_CARE_PROVIDER_SITE_OTHER): Payer: Commercial Managed Care - PPO

## 2021-01-26 DIAGNOSIS — J309 Allergic rhinitis, unspecified: Secondary | ICD-10-CM

## 2021-02-09 ENCOUNTER — Ambulatory Visit (INDEPENDENT_AMBULATORY_CARE_PROVIDER_SITE_OTHER): Payer: Commercial Managed Care - PPO

## 2021-02-09 DIAGNOSIS — J309 Allergic rhinitis, unspecified: Secondary | ICD-10-CM | POA: Diagnosis not present

## 2021-02-23 ENCOUNTER — Ambulatory Visit (INDEPENDENT_AMBULATORY_CARE_PROVIDER_SITE_OTHER): Payer: Commercial Managed Care - PPO

## 2021-02-23 DIAGNOSIS — J309 Allergic rhinitis, unspecified: Secondary | ICD-10-CM

## 2021-02-24 DIAGNOSIS — J3081 Allergic rhinitis due to animal (cat) (dog) hair and dander: Secondary | ICD-10-CM | POA: Diagnosis not present

## 2021-02-24 NOTE — Progress Notes (Signed)
VIALS EXP 02-24-22 

## 2021-02-28 ENCOUNTER — Telehealth: Payer: Self-pay | Admitting: *Deleted

## 2021-02-28 NOTE — Telephone Encounter (Signed)
Tried to reach patient to advise approval for Xolair and submit to [facility] shared pharmacy but unable to leave message for patient

## 2021-03-09 ENCOUNTER — Ambulatory Visit (INDEPENDENT_AMBULATORY_CARE_PROVIDER_SITE_OTHER): Payer: Commercial Managed Care - PPO | Admitting: *Deleted

## 2021-03-09 DIAGNOSIS — J309 Allergic rhinitis, unspecified: Secondary | ICD-10-CM

## 2021-03-15 NOTE — Telephone Encounter (Signed)
Tried to reach patient but mailbox full and unable to leave message

## 2021-03-18 ENCOUNTER — Other Ambulatory Visit: Payer: Self-pay | Admitting: Allergy & Immunology

## 2021-03-18 NOTE — Telephone Encounter (Signed)
Patient was last seen 01/05/21 and azelastine was not listed as a medication to continue to use. It was last refilled September 2021 with no reason of discontinued use please advise.

## 2021-03-21 NOTE — Telephone Encounter (Signed)
Still unable to reach patient- voicemail full

## 2021-03-22 NOTE — Telephone Encounter (Signed)
Noted. I tried as well without success.   Malachi Bonds, MD [facility]

## 2021-03-23 ENCOUNTER — Ambulatory Visit (INDEPENDENT_AMBULATORY_CARE_PROVIDER_SITE_OTHER): Payer: Commercial Managed Care - PPO

## 2021-03-23 DIAGNOSIS — J309 Allergic rhinitis, unspecified: Secondary | ICD-10-CM | POA: Diagnosis not present

## 2021-04-06 ENCOUNTER — Ambulatory Visit (INDEPENDENT_AMBULATORY_CARE_PROVIDER_SITE_OTHER): Payer: Commercial Managed Care - PPO

## 2021-04-06 DIAGNOSIS — J309 Allergic rhinitis, unspecified: Secondary | ICD-10-CM

## 2021-04-20 ENCOUNTER — Telehealth: Payer: Self-pay

## 2021-04-20 ENCOUNTER — Ambulatory Visit (INDEPENDENT_AMBULATORY_CARE_PROVIDER_SITE_OTHER): Payer: Commercial Managed Care - PPO

## 2021-04-20 DIAGNOSIS — J309 Allergic rhinitis, unspecified: Secondary | ICD-10-CM | POA: Diagnosis not present

## 2021-04-20 DIAGNOSIS — T7840XD Allergy, unspecified, subsequent encounter: Secondary | ICD-10-CM

## 2021-04-20 MED ORDER — EPINEPHRINE 0.3 MG/0.3ML IJ SOAJ: 0.3000 mg | INTRAMUSCULAR | 2 refills | Status: DC | PRN

## 2021-04-20 NOTE — Telephone Encounter (Signed)
Ordered labs for a seafood panel.  Malachi Bonds, MD [facility]

## 2021-04-20 NOTE — Telephone Encounter (Signed)
Patient called back and informed of the message below. Lab requisition has been faxed to her job.

## 2021-04-20 NOTE — Telephone Encounter (Signed)
Called, left a message informing patient of Dr. Ellouise Newer recommendation. Informed patient she could go to a [facility] and have them drawn.

## 2021-04-20 NOTE — Telephone Encounter (Signed)
Patient came in to ger her allergy injections and informed me that last Monday 04/11/2021 she had a reaction at work to something. She stated that her face was red and felt as if she was on fire/ bad sunburn. It then spread to her back and arms. Her arms had red blotches that she stated bleed into one another causing her arm to become red all over. Patient stated that the only new thing she had was fresh tuna steak. She stated she usually eats tuna from a can but never fresh. She is wondering if she should be tested or have blood work done for tuna. Please advise.

## 2021-04-24 ENCOUNTER — Other Ambulatory Visit: Payer: Self-pay | Admitting: Allergy & Immunology

## 2021-04-27 ENCOUNTER — Ambulatory Visit (INDEPENDENT_AMBULATORY_CARE_PROVIDER_SITE_OTHER): Payer: Commercial Managed Care - PPO

## 2021-04-27 DIAGNOSIS — J309 Allergic rhinitis, unspecified: Secondary | ICD-10-CM | POA: Diagnosis not present

## 2021-05-03 NOTE — Telephone Encounter (Signed)
Patient's seafood panel came back and Per Dr. Dellis Anes all are negative. Left a detailed message informing patient that labs are negative and if she has any questions or concerns to call the office.

## 2021-05-03 NOTE — Telephone Encounter (Signed)
Thank you for giving her a call.  Malachi Bonds, MD [facility]

## 2021-05-04 ENCOUNTER — Ambulatory Visit (INDEPENDENT_AMBULATORY_CARE_PROVIDER_SITE_OTHER): Payer: Commercial Managed Care - PPO

## 2021-05-04 DIAGNOSIS — J309 Allergic rhinitis, unspecified: Secondary | ICD-10-CM

## 2021-05-11 ENCOUNTER — Ambulatory Visit (INDEPENDENT_AMBULATORY_CARE_PROVIDER_SITE_OTHER): Payer: Commercial Managed Care - PPO

## 2021-05-11 DIAGNOSIS — J309 Allergic rhinitis, unspecified: Secondary | ICD-10-CM

## 2021-05-25 ENCOUNTER — Ambulatory Visit (INDEPENDENT_AMBULATORY_CARE_PROVIDER_SITE_OTHER): Payer: Commercial Managed Care - PPO

## 2021-05-25 DIAGNOSIS — J309 Allergic rhinitis, unspecified: Secondary | ICD-10-CM

## 2021-06-01 ENCOUNTER — Ambulatory Visit (INDEPENDENT_AMBULATORY_CARE_PROVIDER_SITE_OTHER): Payer: Commercial Managed Care - PPO

## 2021-06-01 DIAGNOSIS — J309 Allergic rhinitis, unspecified: Secondary | ICD-10-CM | POA: Diagnosis not present

## 2021-06-10 ENCOUNTER — Encounter: Payer: Self-pay | Admitting: *Deleted

## 2021-06-15 ENCOUNTER — Ambulatory Visit (INDEPENDENT_AMBULATORY_CARE_PROVIDER_SITE_OTHER): Payer: Commercial Managed Care - PPO | Admitting: *Deleted

## 2021-06-15 DIAGNOSIS — J309 Allergic rhinitis, unspecified: Secondary | ICD-10-CM

## 2021-06-29 ENCOUNTER — Ambulatory Visit (INDEPENDENT_AMBULATORY_CARE_PROVIDER_SITE_OTHER): Payer: Commercial Managed Care - PPO | Admitting: *Deleted

## 2021-06-29 DIAGNOSIS — J309 Allergic rhinitis, unspecified: Secondary | ICD-10-CM

## 2021-07-06 ENCOUNTER — Other Ambulatory Visit: Payer: Self-pay

## 2021-07-06 ENCOUNTER — Encounter: Payer: Self-pay | Admitting: Allergy & Immunology

## 2021-07-06 ENCOUNTER — Ambulatory Visit: Payer: Self-pay

## 2021-07-06 ENCOUNTER — Ambulatory Visit: Payer: Commercial Managed Care - PPO | Admitting: Allergy & Immunology

## 2021-07-06 VITALS — BP 116/76 | HR 78 | Temp 98.4°F | Resp 16

## 2021-07-06 DIAGNOSIS — B999 Unspecified infectious disease: Secondary | ICD-10-CM | POA: Diagnosis not present

## 2021-07-06 DIAGNOSIS — L501 Idiopathic urticaria: Secondary | ICD-10-CM

## 2021-07-06 DIAGNOSIS — J302 Other seasonal allergic rhinitis: Secondary | ICD-10-CM

## 2021-07-06 DIAGNOSIS — J4520 Mild intermittent asthma, uncomplicated: Secondary | ICD-10-CM | POA: Diagnosis not present

## 2021-07-06 DIAGNOSIS — J309 Allergic rhinitis, unspecified: Secondary | ICD-10-CM | POA: Diagnosis not present

## 2021-07-06 DIAGNOSIS — J3089 Other allergic rhinitis: Secondary | ICD-10-CM

## 2021-07-06 NOTE — Patient Instructions (Addendum)
1. Allergic urticaria - well controlled - Continue with cetirizine two tablets twice daily. - Continue with Singulair daily.   - We can get Xolair on board once you have your new insurance in place.   2. Allergic rhinitis/allergic conjunctivitis - Continue with the nasal steroid spray - Continue with the allergy shots.   3. Mild intermittent asthma  - Continue with Singulair 10mg  daily.  - Continue Proventil 2 puffs every 4 hours as needed for cough.   4. Recurrent infections - You did have a inadequate protection to Streptococcus pneumonia.  - We will hold off on this workup for now since you are not getting sick like you used to.   5. Return in about 6 months (around 01/06/2022).    Please inform 01/08/2022 of any Emergency Department visits, hospitalizations, or changes in symptoms. Call us before going to the ED for breathing or allergy symptoms since we might be able to fit you in for a sick visit. Feel free to contact us anytime with any questions, problems, or concerns.  It was a pleasure to see you again today!  Websites that have reliable patient information: 1. American Academy of Asthma, Allergy, and Immunology: www.aaaai.org 2. Food Allergy Research and Education (FARE): foodallergy.org 3. Mothers of Asthmatics: http://www.asthmacommunitynetwork.org 4. American College of Allergy, Asthma, and Immunology: www.acaai.org   COVID-19 Vaccine Information can be found at: [place] For questions related to vaccine distribution or appointments, please email [email or web address] or call [phone].   We realize that you might be concerned about having an allergic reaction to the COVID19 vaccines. To help with that concern, WE ARE OFFERING THE COVID19 VACCINES IN OUR OFFICE! Ask the front desk for dates!     "Like" [phone] on Facebook and Instagram for our latest updates!      A healthy democracy works best when [place] participate! Make sure you are registered to vote! If you have moved or changed any of your contact information, you will need to get this updated before voting!  In some cases, you MAY be able to register to vote online: [employer]

## 2021-07-06 NOTE — Progress Notes (Signed)
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 Allergic urticaria - well controlled - Continue with cetirizine two tablets twice daily. - Continue with Singulair daily.   - We can get Xolair on board once you have your new insurance in place.   2. Allergic rhinitis/allergic conjunctivitis - Continue with the nasal steroid spray - Continue with the allergy shots.   3. Mild intermittent asthma  - Continue with Singulair 10mg  daily.  - Continue Proventil 2 puffs every 4 hours as needed for cough.   4. Recurrent infections - You did have a inadequate protection to Streptococcus pneumonia.  - We will hold off on this workup for now since you are not getting sick like you used to.   5. Return in about 6 months (around 01/06/2022).     Subjective:   Jennifer [name] is a 35 y.o. female presenting today for follow up of  Chief Complaint  Patient presents with   Follow-up    Jennifer [name] has a history of the following: Patient Active Problem List   Diagnosis Date Noted   Pelvic pain in female 10/20/2020   Urinary frequency 10/20/2020   Mild persistent asthma without complication 09/17/2019   Seasonal and perennial allergic rhinitis 09/17/2019   Chronic idiopathic urticaria 09/17/2019   Seasonal allergic conjunctivitis 09/17/2019    History obtained from: chart review and patient.  Jennifer [name] is a 35 y.o. female presenting for a follow up visit.  She was  last seen in February 2022.  At that time, we decided to hold off on Xolair since she was doing well with regards to her hives.  We continue with cetirizine 20 mg twice daily.  She did have acute sinusitis and had been treated with doxycycline.  We started her on prednisone at the last visit.  We recommended continued pork avoidance for her food allergies.  Recommended finishing her work-up for recurrent infections.  In the interim, she had a reaction after eating seafood.  Her food panel was negative.  We also try to get Xolair added back on, but she was not following up on phone calls from [place].  Since last visit, she has done very well.   Asthma/Respiratory Symptom History: She remains on the albuterol as needed. Jennifer [name]'s asthma has been well controlled. She has not required rescue medication, experienced nocturnal awakenings due to lower respiratory symptoms, nor have activities of daily living been limited. She has required no Emergency Department or Urgent Care visits for her asthma. She has required zero courses of systemic steroids for asthma exacerbations since the last visit. ACT score today is 23, indicating excellent asthma symptom control.   Allergic Rhinitis Symptom History: Allergy shots are going well. She is no longer having large locals with the epi rinses.  She is using an antihistamine daily. Otherwise she does not use any other medications. She does not use a nose spray on a routine basis.   Jennifer [name] is on allergen immunotherapy. She receives two  injections. Immunotherapy script #1 contains trees, weeds, grasses, cat and dog. She currently receives 0.84mL of the RED vial (1/100). Immunotherapy script #2 contains ragweed, molds and dust mites. She currently receives 0.83mL of the RED vial (1/100). She started shots November of 2019 and reached maintenance in June of 2020. She was having large local reactions but these have stopped since starting epi rinses/   Food Allergy Symptom History:  She has eaten tuna since the last episode without a problem. She seasoned with the same stuff. She did have some diarrhea.  No one else with that tuna got sick.   Urticaria Symptom History: She is on antihistamines for her urticaria.  She is interested in starting Xolair again, but since she is changing jobs she would like to hold off.  She is switching jobs now. 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 She will be working in [place] and needs to find a place to live there during the week.  Otherwise, there have been no changes to her past medical history, surgical history, family history, or social history.    Review of Systems  Constitutional: Negative.  Negative for chills, fever, malaise/fatigue and weight loss.  HENT: Negative.  Negative for congestion, ear discharge and ear pain.   Eyes:  Negative for pain, discharge and redness.  Respiratory:  Negative for cough, sputum production, shortness of breath and wheezing.   Cardiovascular: Negative.  Negative for chest pain and palpitations.  Gastrointestinal:  Negative for abdominal pain, heartburn, nausea and vomiting.  Skin:  Positive for itching and rash.  Neurological:  Negative for dizziness and headaches.  Endo/Heme/Allergies:  Positive for environmental allergies. Does not bruise/bleed easily.      Objective:   Blood pressure 116/76, pulse 78, temperature 98.4 F (36.9 C), temperature source Temporal, resp. rate 16, SpO2 97 %. There is no height or weight on file to calculate BMI.   Physical Exam:  Physical Exam Constitutional:      Appearance: She is well-developed.     Comments: Bright orange and red hair.  She has a mask to match this.  HENT:     Head: Normocephalic and atraumatic.     Right Ear: Tympanic membrane, ear canal and external ear normal.     Left Ear: Tympanic membrane, ear canal and external ear normal.     Nose: No nasal deformity, septal deviation, mucosal edema or rhinorrhea.      Right Turbinates: Enlarged. Not swollen.     Left Turbinates: Enlarged. Not swollen.     Right Sinus: No maxillary sinus tenderness or frontal sinus tenderness.     Left Sinus: No maxillary sinus tenderness or frontal sinus tenderness.     Mouth/Throat:     Mouth: Mucous membranes are not pale and not dry.     Pharynx: Uvula midline.  Eyes:     General: Lids are normal. Allergic shiner present.        Right eye: No discharge.        Left eye: No discharge.     Conjunctiva/sclera: Conjunctivae normal.     Right eye: Right conjunctiva is not injected. No chemosis.    Left eye: Left conjunctiva is not injected. No chemosis.    Pupils: Pupils are equal, round, and reactive to light.  Cardiovascular:     Rate and Rhythm: Normal rate and regular rhythm.     Heart sounds: Normal heart sounds.  Pulmonary:     Effort: Pulmonary effort is  normal. No tachypnea, accessory muscle usage or respiratory distress.     Breath sounds: Normal breath sounds. No wheezing, rhonchi or rales.  Chest:     Chest wall: No tenderness.  Lymphadenopathy:     Cervical: No cervical adenopathy.  Skin:    General: Skin is warm.     Capillary Refill: Capillary refill takes less than 2 seconds.     Coloration: Skin is not pale.     Findings: No abrasion, erythema, petechiae or rash. Rash is not papular, urticarial or vesicular.     Comments: Some excoriations.  No urticaria.  Neurological:     Mental Status: She is alert.  Psychiatric:        Behavior: Behavior is cooperative.     Diagnostic studies:    Spirometry: results normal (FEV1: 2.75/95%, FVC: 3.31/95%, FEV1/FVC: 83%).    Spirometry consistent with normal pattern.    Allergy Studies: none         Malachi Bonds, MD  [facility]

## 2021-07-07 NOTE — Progress Notes (Signed)
VIALS MADE. EXP 07-07-22 

## 2021-07-08 DIAGNOSIS — J3081 Allergic rhinitis due to animal (cat) (dog) hair and dander: Secondary | ICD-10-CM

## 2021-07-22 ENCOUNTER — Ambulatory Visit (INDEPENDENT_AMBULATORY_CARE_PROVIDER_SITE_OTHER): Payer: Commercial Managed Care - PPO

## 2021-07-22 DIAGNOSIS — J309 Allergic rhinitis, unspecified: Secondary | ICD-10-CM | POA: Diagnosis not present

## 2021-07-27 ENCOUNTER — Ambulatory Visit: Payer: Commercial Managed Care - PPO | Admitting: Urology

## 2021-08-05 ENCOUNTER — Ambulatory Visit (INDEPENDENT_AMBULATORY_CARE_PROVIDER_SITE_OTHER): Payer: Commercial Managed Care - PPO | Admitting: *Deleted

## 2021-08-05 DIAGNOSIS — J309 Allergic rhinitis, unspecified: Secondary | ICD-10-CM

## 2021-09-02 ENCOUNTER — Ambulatory Visit (INDEPENDENT_AMBULATORY_CARE_PROVIDER_SITE_OTHER): Payer: 59

## 2021-09-02 DIAGNOSIS — J309 Allergic rhinitis, unspecified: Secondary | ICD-10-CM | POA: Diagnosis not present

## 2021-09-16 ENCOUNTER — Ambulatory Visit (INDEPENDENT_AMBULATORY_CARE_PROVIDER_SITE_OTHER): Payer: 59

## 2021-09-16 DIAGNOSIS — J309 Allergic rhinitis, unspecified: Secondary | ICD-10-CM | POA: Diagnosis not present

## 2021-09-23 ENCOUNTER — Ambulatory Visit (INDEPENDENT_AMBULATORY_CARE_PROVIDER_SITE_OTHER): Payer: 59

## 2021-09-23 DIAGNOSIS — J309 Allergic rhinitis, unspecified: Secondary | ICD-10-CM

## 2021-10-07 ENCOUNTER — Ambulatory Visit (INDEPENDENT_AMBULATORY_CARE_PROVIDER_SITE_OTHER): Payer: 59

## 2021-10-07 DIAGNOSIS — J309 Allergic rhinitis, unspecified: Secondary | ICD-10-CM | POA: Diagnosis not present

## 2021-11-04 ENCOUNTER — Telehealth: Payer: Self-pay

## 2021-11-04 ENCOUNTER — Ambulatory Visit (INDEPENDENT_AMBULATORY_CARE_PROVIDER_SITE_OTHER): Payer: 59

## 2021-11-04 DIAGNOSIS — J309 Allergic rhinitis, unspecified: Secondary | ICD-10-CM

## 2021-11-04 MED ORDER — ALBUTEROL SULFATE HFA 108 (90 BASE) MCG/ACT IN AERS: INHALATION_SPRAY | RESPIRATORY_TRACT | 0 refills | Status: DC

## 2021-11-04 MED ORDER — AZELASTINE HCL 137 MCG/SPRAY NA SOLN
2.0000 | Freq: Two times a day (BID) | NASAL | 0 refills | Status: DC
Start: 2021-11-04 — End: 2022-07-07

## 2021-11-04 MED ORDER — FLUTICASONE PROPIONATE 50 MCG/ACT NA SUSP: 2.0000 | Freq: Every day | NASAL | 0 refills | Status: DC | PRN

## 2021-11-04 NOTE — Telephone Encounter (Signed)
Patient came in to get injection and needed refills on medications. Sent them to [facility] in [place]

## 2021-11-25 ENCOUNTER — Ambulatory Visit (INDEPENDENT_AMBULATORY_CARE_PROVIDER_SITE_OTHER): Payer: 59

## 2021-11-25 DIAGNOSIS — J309 Allergic rhinitis, unspecified: Secondary | ICD-10-CM

## 2021-12-02 ENCOUNTER — Ambulatory Visit (INDEPENDENT_AMBULATORY_CARE_PROVIDER_SITE_OTHER): Payer: 59

## 2021-12-02 DIAGNOSIS — J309 Allergic rhinitis, unspecified: Secondary | ICD-10-CM

## 2021-12-09 ENCOUNTER — Ambulatory Visit (INDEPENDENT_AMBULATORY_CARE_PROVIDER_SITE_OTHER): Payer: 59

## 2021-12-09 DIAGNOSIS — J309 Allergic rhinitis, unspecified: Secondary | ICD-10-CM | POA: Diagnosis not present

## 2022-01-06 ENCOUNTER — Ambulatory Visit (INDEPENDENT_AMBULATORY_CARE_PROVIDER_SITE_OTHER): Payer: 59 | Admitting: *Deleted

## 2022-01-06 DIAGNOSIS — J309 Allergic rhinitis, unspecified: Secondary | ICD-10-CM | POA: Diagnosis not present

## 2022-01-27 ENCOUNTER — Ambulatory Visit (INDEPENDENT_AMBULATORY_CARE_PROVIDER_SITE_OTHER): Payer: 59

## 2022-01-27 DIAGNOSIS — J309 Allergic rhinitis, unspecified: Secondary | ICD-10-CM | POA: Diagnosis not present

## 2022-02-17 ENCOUNTER — Ambulatory Visit (INDEPENDENT_AMBULATORY_CARE_PROVIDER_SITE_OTHER): Payer: 59

## 2022-02-17 DIAGNOSIS — J309 Allergic rhinitis, unspecified: Secondary | ICD-10-CM | POA: Diagnosis not present

## 2022-03-03 ENCOUNTER — Ambulatory Visit (INDEPENDENT_AMBULATORY_CARE_PROVIDER_SITE_OTHER): Payer: 59

## 2022-03-03 DIAGNOSIS — J309 Allergic rhinitis, unspecified: Secondary | ICD-10-CM | POA: Diagnosis not present

## 2022-03-09 DIAGNOSIS — J3089 Other allergic rhinitis: Secondary | ICD-10-CM

## 2022-03-09 NOTE — Progress Notes (Signed)
VIALS EXP 03-10-23 ?

## 2022-03-10 ENCOUNTER — Ambulatory Visit (INDEPENDENT_AMBULATORY_CARE_PROVIDER_SITE_OTHER): Payer: 59

## 2022-03-10 DIAGNOSIS — J309 Allergic rhinitis, unspecified: Secondary | ICD-10-CM | POA: Diagnosis not present

## 2022-03-31 ENCOUNTER — Ambulatory Visit (INDEPENDENT_AMBULATORY_CARE_PROVIDER_SITE_OTHER): Payer: 59

## 2022-03-31 DIAGNOSIS — J309 Allergic rhinitis, unspecified: Secondary | ICD-10-CM

## 2022-04-21 ENCOUNTER — Ambulatory Visit (INDEPENDENT_AMBULATORY_CARE_PROVIDER_SITE_OTHER): Payer: 59

## 2022-04-21 DIAGNOSIS — J309 Allergic rhinitis, unspecified: Secondary | ICD-10-CM | POA: Diagnosis not present

## 2022-05-05 ENCOUNTER — Ambulatory Visit (INDEPENDENT_AMBULATORY_CARE_PROVIDER_SITE_OTHER): Payer: 59

## 2022-05-05 DIAGNOSIS — J309 Allergic rhinitis, unspecified: Secondary | ICD-10-CM | POA: Diagnosis not present

## 2022-05-10 ENCOUNTER — Ambulatory Visit (INDEPENDENT_AMBULATORY_CARE_PROVIDER_SITE_OTHER): Payer: 59

## 2022-05-10 DIAGNOSIS — J309 Allergic rhinitis, unspecified: Secondary | ICD-10-CM

## 2022-05-19 ENCOUNTER — Ambulatory Visit (INDEPENDENT_AMBULATORY_CARE_PROVIDER_SITE_OTHER): Payer: 59

## 2022-05-19 DIAGNOSIS — J309 Allergic rhinitis, unspecified: Secondary | ICD-10-CM | POA: Diagnosis not present

## 2022-05-24 ENCOUNTER — Ambulatory Visit (INDEPENDENT_AMBULATORY_CARE_PROVIDER_SITE_OTHER): Payer: 59

## 2022-05-24 DIAGNOSIS — J309 Allergic rhinitis, unspecified: Secondary | ICD-10-CM | POA: Diagnosis not present

## 2022-05-31 ENCOUNTER — Ambulatory Visit (INDEPENDENT_AMBULATORY_CARE_PROVIDER_SITE_OTHER): Payer: 59

## 2022-05-31 DIAGNOSIS — J309 Allergic rhinitis, unspecified: Secondary | ICD-10-CM

## 2022-06-21 ENCOUNTER — Ambulatory Visit (INDEPENDENT_AMBULATORY_CARE_PROVIDER_SITE_OTHER): Payer: 59

## 2022-06-21 DIAGNOSIS — J309 Allergic rhinitis, unspecified: Secondary | ICD-10-CM | POA: Diagnosis not present

## 2022-07-07 ENCOUNTER — Ambulatory Visit: Payer: 59 | Admitting: Allergy & Immunology

## 2022-07-07 ENCOUNTER — Encounter: Payer: Self-pay | Admitting: Allergy & Immunology

## 2022-07-07 VITALS — BP 108/80 | HR 84 | Temp 98.6°F | Resp 18 | Ht 61.5 in | Wt 170.5 lb

## 2022-07-07 DIAGNOSIS — J302 Other seasonal allergic rhinitis: Secondary | ICD-10-CM

## 2022-07-07 DIAGNOSIS — L501 Idiopathic urticaria: Secondary | ICD-10-CM

## 2022-07-07 DIAGNOSIS — J4520 Mild intermittent asthma, uncomplicated: Secondary | ICD-10-CM

## 2022-07-07 DIAGNOSIS — B999 Unspecified infectious disease: Secondary | ICD-10-CM | POA: Diagnosis not present

## 2022-07-07 DIAGNOSIS — J3089 Other allergic rhinitis: Secondary | ICD-10-CM | POA: Diagnosis not present

## 2022-07-07 MED ORDER — FLUTICASONE PROPIONATE 50 MCG/ACT NA SUSP: 2.0000 | Freq: Every day | NASAL | 5 refills | Status: DC | PRN

## 2022-07-07 MED ORDER — ALBUTEROL SULFATE HFA 108 (90 BASE) MCG/ACT IN AERS: INHALATION_SPRAY | RESPIRATORY_TRACT | 1 refills | Status: DC

## 2022-07-07 MED ORDER — MONTELUKAST SODIUM 10 MG PO TABS: 10.0000 mg | ORAL_TABLET | Freq: Every day | ORAL | 5 refills | Status: DC

## 2022-07-07 MED ORDER — AZELASTINE HCL 137 MCG/SPRAY NA SOLN: 2.0000 | Freq: Two times a day (BID) | NASAL | 5 refills | Status: DC

## 2022-07-07 NOTE — Patient Instructions (Addendum)
1. Allergic urticaria - well controlled - Continue with cetirizine two tablets twice daily. - Continue with Singulair daily.   - We can always start Xolair again (it is approved for home use so you can give it to yourself).  2. Allergic rhinitis/allergic conjunctivitis - Continue with the nasal steroid spray - Continue with the allergy shots.  - We will plan to stop shots in December 2023.   3. Mild intermittent asthma  - Continue with Singulair 10mg  daily.  - Continue Proventil 2 puffs every 4 hours as needed for cough.   4. Recurrent infections - You did have a inadequate protection to Streptococcus pneumonia.  - We will hold off on this workup for now since you are not getting sick like you used to.   5. Return in about 1 year (around 07/08/2023).    Please inform 07/10/2023 of any Emergency Department visits, hospitalizations, or changes in symptoms. Call us before going to the ED for breathing or allergy symptoms since we might be able to fit you in for a sick visit. Feel free to contact us anytime with any questions, problems, or concerns.  It was a pleasure to see you again today!  Websites that have reliable patient information: 1. American Academy of Asthma, Allergy, and Immunology: www.aaaai.org 2. Food Allergy Research and Education (FARE): foodallergy.org 3. Mothers of Asthmatics: http://www.asthmacommunitynetwork.org 4. American College of Allergy, Asthma, and Immunology: www.acaai.org   COVID-19 Vaccine Information can be found at: [place] For questions related to vaccine distribution or appointments, please email [email or web address] or call [phone].   We realize that you might be concerned about having an allergic reaction to the COVID19 vaccines. To help with that concern, WE ARE OFFERING THE COVID19 VACCINES IN OUR OFFICE! Ask the front desk for dates!     "Like" [phone] on Facebook and Instagram  for our latest updates!      A healthy democracy works best when [place] participate! Make sure you are registered to vote! If you have moved or changed any of your contact information, you will need to get this updated before voting!  In some cases, you MAY be able to register to vote online: [employer]

## 2022-07-07 NOTE — Progress Notes (Unsigned)
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   Subjective:   Jennifer [name] is a 36 y.o. female presenting today for follow up of  Chief Complaint  Patient presents with  . Allergic Rhinitis     Has been in [place] and has had flare ups. (Runny nose, headaches)  . Asthma    As long as nothing triggers her asthma she is doing well. Has had to use her rescue inhaler either once or twice since her last visit.   Marland Kitchen Pruritus    Has had about 2-3 episodes of itching since her last visit.   . Other    Wants to talk about how long she has to be on allergy shots.  . Medication Refill    ALBUTEROL,NASAL SPRAYS,SINGULAIR,     Jennifer [name] has a history of the following: Patient Active Problem List   Diagnosis Date Noted  . Pelvic pain in female 10/20/2020  . Urinary frequency 10/20/2020  . Mild persistent asthma without complication 09/17/2019  . Seasonal and perennial allergic rhinitis 09/17/2019  . Chronic idiopathic urticaria 09/17/2019  . Seasonal allergic conjunctivitis 09/17/2019    History obtained from: chart review and patient.  Jennifer [name] is a 36 y.o. female presenting for a follow up visit.  She was last seen in August 2022.  At that time, her urticaria was well controlled with cetirizine 2 tablets twice daily as well as Singulair.  For her allergic rhinoconjunctivitis, we continue with her  nasal steroid spray and her allergy shots.  Asthma was controlled with Singulair and albuterol as needed.  She had an inadequate response to Streptococcus pneumonia.  We recommended vaccination in the Pneumovax, but she was not getting sick as often as she was before and decided to hold off.  Since last visit, she has done well.   Asthma/Respiratory Symptom History: She has only used her albuterol 1-2 times since the last visit.  She went around paint fumes when she went to see her Dad yesterday. Perfumes and colognes make her symptoms.   Allergic Rhinitis Symptom History: Symptoms are well controlled with the current regimen. She does through certain times of the year where she uses her nose spry more consistently.   Jennifer [name] is on allergen immunotherapy. She receives two injections. Immunotherapy script #1 contains trees, weeds, grasses, cat and dog. She currently receives 0.35mL of the RED vial (1/100). Immunotherapy script #2 contains ragweed, molds and dust mites. She currently receives 0.53mL of the RED vial (1/100). She started shots November of 2019 and reached maintenance in June of 2020. She was having large local reactions but these have stopped since starting epi rinses.   Urticaria Symptom History: She is on antihistamines for her urticaria. She has noticed that certain foods such as gluten aggravate it. She remains on cetirizine 20mg  BID. She has had to keep this religiously.   She just had a UTI. Otherwise she has  not had a sinus infection in years. She has had some facial pressure, but nothing that she has treated. She did have COVID last October 2022, but otherwise she has been good.   She is doing traveling MRI still. She is in [place] and then she is going to be [name] until December 9th. She is mostly in [place]. She has tried to stay local and then branch out from there.   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 Her mother had an MI in January 2023. She is on disability.  Her father has leukemia.   Otherwise, there have been no changes to her past medical history, surgical history, family history, or social history.    Review of Systems  Constitutional: Negative.  Negative for chills, fever, malaise/fatigue and weight loss.  HENT: Negative.  Negative for congestion, ear discharge and ear pain.   Eyes:  Negative for pain, discharge and redness.  Respiratory:  Negative for cough, sputum production, shortness of breath and wheezing.   Cardiovascular: Negative.  Negative for chest pain and palpitations.  Gastrointestinal:  Negative for abdominal pain, constipation, diarrhea, heartburn, nausea and vomiting.  Skin:  Positive for itching and rash.  Neurological:  Negative for dizziness and headaches.  Endo/Heme/Allergies:  Positive for environmental allergies. Does not bruise/bleed easily.       Objective:   Blood pressure 108/80, pulse 84, temperature 98.6 F (37 C), resp. rate 18, height 5' 1.5" (1.562 m), weight 170 lb 8 oz (77.3 kg), SpO2 99 %. Body mass index is 31.69 kg/m.    Physical Exam Vitals reviewed.  Constitutional:      Appearance: She is well-developed.     Comments: Bright orange and red hair.  She has a mask to match this.  HENT:     Head: Normocephalic and atraumatic.     Right Ear: Tympanic membrane, ear canal and external ear normal.     Left Ear: Tympanic membrane, ear canal and external ear normal.     Nose: No nasal deformity, septal deviation, mucosal edema or rhinorrhea.     Right Turbinates: Enlarged and swollen.     Left Turbinates: Enlarged and swollen.     Right Sinus: No maxillary sinus tenderness or frontal sinus tenderness.     Left Sinus: No maxillary sinus tenderness or frontal sinus tenderness.     Mouth/Throat:     Mouth: Mucous membranes are not pale and not dry.     Pharynx: Uvula midline.  Eyes:     General: Lids are normal. Allergic shiner present.        Right eye: No discharge.        Left eye: No  discharge.     Conjunctiva/sclera: Conjunctivae normal.     Right eye: Right conjunctiva is not injected. No chemosis.    Left eye: Left conjunctiva is not injected. No chemosis.    Pupils: Pupils are equal, round, and reactive to light.  Cardiovascular:     Rate and Rhythm: Normal rate and regular rhythm.     Heart sounds: Normal heart sounds.  Pulmonary:     Effort: Pulmonary effort is normal. No tachypnea, accessory muscle usage or respiratory distress.     Breath sounds: Normal breath sounds. No wheezing, rhonchi or rales.  Chest:     Chest wall: No tenderness.  Lymphadenopathy:     Cervical: No cervical adenopathy.  Skin:    General: Skin is warm.     Capillary Refill: Capillary  refill takes less than 2 seconds.     Coloration: Skin is not pale.     Findings: No abrasion, erythema, petechiae or rash. Rash is not papular, urticarial or vesicular.     Comments: Some excoriations.  No urticaria.  Neurological:     Mental Status: She is alert.  Psychiatric:        Behavior: Behavior is cooperative.     Diagnostic studies:    Spirometry: results normal (FEV1: 2.50/90%, FVC: 3.10/93%, FEV1/FVC: 81%).    Spirometry consistent with normal pattern.   Allergy Studies: none       Malachi Bonds, MD  [facility]

## 2022-07-10 ENCOUNTER — Encounter: Payer: Self-pay | Admitting: Allergy & Immunology

## 2022-07-21 ENCOUNTER — Ambulatory Visit (INDEPENDENT_AMBULATORY_CARE_PROVIDER_SITE_OTHER): Payer: 59

## 2022-07-21 DIAGNOSIS — J309 Allergic rhinitis, unspecified: Secondary | ICD-10-CM

## 2022-08-16 ENCOUNTER — Ambulatory Visit (INDEPENDENT_AMBULATORY_CARE_PROVIDER_SITE_OTHER): Payer: 59

## 2022-08-16 DIAGNOSIS — J309 Allergic rhinitis, unspecified: Secondary | ICD-10-CM

## 2022-09-15 ENCOUNTER — Ambulatory Visit (INDEPENDENT_AMBULATORY_CARE_PROVIDER_SITE_OTHER): Payer: 59

## 2022-09-15 DIAGNOSIS — J309 Allergic rhinitis, unspecified: Secondary | ICD-10-CM

## 2022-10-25 ENCOUNTER — Ambulatory Visit (INDEPENDENT_AMBULATORY_CARE_PROVIDER_SITE_OTHER): Payer: BC Managed Care – PPO

## 2022-10-25 DIAGNOSIS — J309 Allergic rhinitis, unspecified: Secondary | ICD-10-CM | POA: Diagnosis not present

## 2023-08-20 ENCOUNTER — Ambulatory Visit: Payer: BLUE CROSS/BLUE SHIELD | Admitting: Internal Medicine

## 2023-08-20 ENCOUNTER — Encounter: Payer: Self-pay | Admitting: Internal Medicine

## 2023-08-20 VITALS — BP 104/60 | HR 100 | Temp 98.2°F | Resp 18 | Ht 61.81 in | Wt 174.2 lb

## 2023-08-20 DIAGNOSIS — J4520 Mild intermittent asthma, uncomplicated: Secondary | ICD-10-CM

## 2023-08-20 DIAGNOSIS — L501 Idiopathic urticaria: Secondary | ICD-10-CM

## 2023-08-20 DIAGNOSIS — J302 Other seasonal allergic rhinitis: Secondary | ICD-10-CM

## 2023-08-20 DIAGNOSIS — J3089 Other allergic rhinitis: Secondary | ICD-10-CM

## 2023-08-20 MED ORDER — ALBUTEROL SULFATE HFA 108 (90 BASE) MCG/ACT IN AERS: 1.0000 | INHALATION_SPRAY | Freq: Four times a day (QID) | RESPIRATORY_TRACT | 1 refills | Status: DC | PRN

## 2023-08-20 MED ORDER — FLUTICASONE PROPIONATE 50 MCG/ACT NA SUSP: 2.0000 | Freq: Every day | NASAL | 5 refills | Status: DC

## 2023-08-20 MED ORDER — AZELASTINE HCL 137 MCG/SPRAY NA SOLN: 2.0000 | Freq: Two times a day (BID) | NASAL | 5 refills | Status: DC | PRN

## 2023-08-20 MED ORDER — CETIRIZINE HCL 10 MG PO TABS: 10.0000 mg | ORAL_TABLET | Freq: Two times a day (BID) | ORAL | 5 refills | Status: AC | PRN

## 2023-08-20 MED ORDER — MONTELUKAST SODIUM 10 MG PO TABS
10.0000 mg | ORAL_TABLET | Freq: Every day | ORAL | 5 refills | Status: DC
Start: 2023-08-20 — End: 2024-04-25

## 2023-08-20 NOTE — Patient Instructions (Addendum)
Idiopathic urticaria - Continue with Cetirizine (Zyrtec) 10mg  tablets twice daily. - Continue with Singulair 10mg  daily.    2. Allergic rhinitis/allergic conjunctivitis - SPT 2019: grasses, weeds, trees, molds, dust mites, cats, dogs - Avoidance measures discussed. - Use nasal saline rinses before nose sprays such as with Neilmed Sinus Rinse.  Use distilled water.   - Use Flonase 2 sprays each nostril daily. Aim upward and outward. - Use Azelastine 1-2 sprays each nostril twice daily as needed for runny nose, drainage, sneezing, congestion. Aim upward and outward. - Use Zyrtec 10 mg daily or twice daily.   - Use Singulair 10mg  daily.   Stop if there are any mood/behavioral changes. - For eyes, use Olopatadine or Ketotifen 1 eye drop daily as needed for itchy, watery eyes.  Available over the counter, if not covered by insurance.  - Previously on AIT.  3. Mild intermittent asthma  - Maintenance inhaler: continue Singulair 10mg  daily.  - Rescue inhaler: Albuterol 2 puffs via spacer or 1 vial via nebulizer every 4-6 hours as needed for respiratory symptoms of cough, shortness of breath, or wheezing Asthma control goals:  Full participation in all desired activities (may need albuterol before activity) Albuterol use two times or less a week on average (not counting use with activity) Cough interfering with sleep two times or less a month Oral steroids no more than once a year No hospitalizations   4. Recurrent infections - We will hold off on this workup for now since you are not getting sick like you used to.   5. Return in about 6 months (around 02/17/2024).

## 2023-08-20 NOTE — Progress Notes (Signed)
FOLLOW UP Date of Service/Encounter:  08/20/23   Subjective:  Jennifer [name] (DOB: [birth year]) is a 37 y.o. female who [facility] for follow up for mild intermittent asthma, allergic rhinoconjunctivitis, idiopathic urticaria, hx of recurrent infections.   History obtained from: chart review and patient. Last visit was with Dr. Dellis Anes on July 07, 2022 and at the time was doing well so they had discussed stopping AIT.  Also on twice daily antihistamines, Flonase, azelastine.  Allergies: Since last visit, she reports doing okay.  She does have some flareups especially during ragweed season with congestion, drainage, runny nose.  Also sometimes with itchy watery eyes. She has not noticed worsening with stopping AIT.  Taking Zyrtec daily or twice daily.  Does use Claritin-D if it is really flared up for a few days.  Has not been using her nose sprays but plans to start Flonase and azelastine soon.  Asthma: Asthma Control Test: ACT Total Score: 22.    Doing well overall.  Rarely needs her albuterol.  No ER visits or oral prednisone or nighttime symptoms since last visit.  Taking Singulair daily but has forgotten recently.   Urticaria: Has not seen any hives.  Does get a little itchy with gluten.  Well-controlled on Zyrtec twice daily.  Recurrent infections: Has not been sick since our last visit in August 2023 that required any antibiotics.  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  She does take vitamins regularly and this has helped   Past Medical History: Past Medical History:  Diagnosis Date   Anxiety    Asthma    Eczema    GERD (gastroesophageal reflux disease)    GERD (gastroesophageal reflux disease)    Thyroid disease    Thyroid disease    Urticaria     Objective:  BP 104/60   Pulse 100   Temp 98.2 F (36.8 C)   Resp 18   Ht 5' 1.81" (1.57 m)   Wt 174 lb 4 oz (79 kg)   SpO2  97%   BMI 32.07 kg/m  Body mass index is 32.07 kg/m. Physical Exam: GEN: alert, well developed HEENT: clear conjunctiva, TM grey and translucent, nose with mild inferior turbinate hypertrophy, pink nasal mucosa, + clear rhinorrhea, no cobblestoning HEART: regular rate and rhythm, no murmur LUNGS: clear to auscultation bilaterally, no coughing, unlabored respiration SKIN: no rashes or lesions  Spirometry:  Tracings reviewed. Her effort: Good reproducible efforts. FVC: 3.08L FEV1: 2.81L, 102% predicted FEV1/FVC ratio: 91% Interpretation: Spirometry consistent with normal pattern.  Please see scanned spirometry results for details.  Assessment:   1. Mild intermittent asthma, uncomplicated   2. Seasonal and perennial allergic rhinitis   3. Idiopathic urticaria     Plan/Recommendations:  Idiopathic urticaria - Well controlled.  - Continue with Cetirizine 10mg  tablets once daily. Can use twice daily if needed.  - Continue with Singulair 10mg  daily.    2. Allergic rhinitis/allergic conjunctivitis - Completed 3 years of AIT previously. Some symptoms during Fall, discussed restarting nose sprays.  - SPT 2019: grasses, weeds, trees, molds, dust mites, cats, dogs - Avoidance measures discussed. - Use nasal saline rinses before nose sprays such as with Neilmed Sinus Rinse.  Use distilled water.   - Use Flonase 2 sprays each nostril daily. Aim upward and outward. - Use Azelastine 1-2 sprays each nostril twice daily as needed for runny nose, drainage, sneezing, congestion. Aim upward and  outward. - Use Zyrtec 10 mg daily or twice daily.   - Use Singulair 10mg  daily.   Stop if there are any mood/behavioral changes. - For eyes, use Olopatadine or Ketotifen 1 eye drop daily as needed for itchy, watery eyes.  Available over the counter, if not covered by insurance.  - Previously on AIT.  3. Mild intermittent asthma  - Well controlled. MDI technique discussed.  Spirometry today was normal.   - Maintenance inhaler: continue Singulair 10mg  daily.  - Rescue inhaler: Albuterol 2 puffs via spacer or 1 vial via nebulizer every 4-6 hours as needed for respiratory symptoms of cough, shortness of breath, or wheezing Asthma control goals:  Full participation in all desired activities (may need albuterol before activity) Albuterol use two times or less a week on average (not counting use with activity) Cough interfering with sleep two times or less a month Oral steroids no more than once a year No hospitalizations   4. Recurrent infections - You did have a inadequate protection to Streptococcus pneumonia in the past.  - We will hold off on this workup for now since you are not getting sick like you used to.     Return in about 6 months (around 02/17/2024).  Alesia Morin, MD [facility]

## 2024-02-27 ENCOUNTER — Ambulatory Visit: Payer: BLUE CROSS/BLUE SHIELD | Admitting: Allergy & Immunology

## 2024-04-25 ENCOUNTER — Other Ambulatory Visit: Payer: Self-pay

## 2024-04-25 ENCOUNTER — Encounter: Payer: Self-pay | Admitting: Allergy & Immunology

## 2024-04-25 ENCOUNTER — Ambulatory Visit: Admitting: Allergy & Immunology

## 2024-04-25 VITALS — BP 108/62 | HR 89 | Temp 98.6°F | Resp 18 | Ht 62.21 in | Wt 175.4 lb

## 2024-04-25 DIAGNOSIS — J302 Other seasonal allergic rhinitis: Secondary | ICD-10-CM

## 2024-04-25 DIAGNOSIS — B999 Unspecified infectious disease: Secondary | ICD-10-CM | POA: Diagnosis not present

## 2024-04-25 DIAGNOSIS — J4520 Mild intermittent asthma, uncomplicated: Secondary | ICD-10-CM | POA: Diagnosis not present

## 2024-04-25 DIAGNOSIS — J3089 Other allergic rhinitis: Secondary | ICD-10-CM

## 2024-04-25 MED ORDER — AZELASTINE HCL 137 MCG/SPRAY NA SOLN: 2.0000 | Freq: Two times a day (BID) | NASAL | 4 refills | Status: AC | PRN

## 2024-04-25 MED ORDER — MONTELUKAST SODIUM 10 MG PO TABS: 10.0000 mg | ORAL_TABLET | Freq: Every day | ORAL | 4 refills | Status: AC

## 2024-04-25 MED ORDER — FLUTICASONE PROPIONATE 50 MCG/ACT NA SUSP: 2.0000 | Freq: Every day | NASAL | 4 refills | Status: AC

## 2024-04-25 MED ORDER — ALBUTEROL SULFATE HFA 108 (90 BASE) MCG/ACT IN AERS: 1.0000 | INHALATION_SPRAY | Freq: Four times a day (QID) | RESPIRATORY_TRACT | 1 refills | Status: AC | PRN

## 2024-04-25 NOTE — Progress Notes (Signed)
 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 - Continue with Singulair  10mg  daily.    2. Allergic rhinitis/allergic conjunctivitis - doing better s/p AIT - SPT 2019: grasses, weeds, trees, molds, dust mites, cats, dogs - Use Flonase  2 sprays each nostril daily. Aim upward and outward. - Use Azelastine  1-2 sprays each nostril twice daily as needed for runny nose, drainage, sneezing, congestion. Aim upward and outward. - Use Zyrtec  10 mg daily or twice daily.   - Use Singulair  10mg  daily.    - For eyes, use Olopatadine or Ketotifen 1 eye drop daily as needed for itchy, watery eyes.  Available over the counter, if not covered by insurance.   3. Mild intermittent asthma  - Lung testing is excellent today. - Restart the SINGULAIR !  Put it in your pill box!  - Maintenance: continue Singulair  10mg  daily.  - Rescue inhaler: Albuterol  2 puffs via spacer or 1 vial via nebulizer every 4-6 hours as needed for respiratory symptoms of cough, shortness of breath, or wheezing Asthma control goals:  Full participation in all desired activities (may need albuterol  before activity) Albuterol  use two times or less a week on average (not counting use with activity) Cough interfering with sleep two times or less a  month Oral steroids no more than once a year No hospitalizations  4. Recurrent infections - We will hold off on this workup for now since you are not getting sick like you used to.   5. Return in about 1 year (around 04/25/2025). You can have the follow up appointment with Jennifer [name] or a Nurse Practicioner (our Nurse Practitioners are excellent and always have Physician oversight!).   Subjective:   Jennifer [name] is a 38 y.o. female presenting today for follow up of  Chief Complaint  Patient presents with   Follow-up    Jennifer [name] has a history of the following: Patient Active Problem List   Diagnosis Date Noted   Pelvic pain in female 10/20/2020   Urinary frequency 10/20/2020   Mild persistent asthma without complication 09/17/2019   Seasonal and perennial allergic rhinitis 09/17/2019   Chronic idiopathic urticaria 09/17/2019   Seasonal allergic conjunctivitis 09/17/2019    History obtained from: chart review and patient.  Discussed the use of AI scribe software for clinical note transcription with the patient and/or guardian, who gave verbal consent to proceed.  Jennifer [name] is a 38 y.o. female presenting for a follow up visit.  She was last seen in September 2024 by Dr. Lydia [name].  At that time, she was doing pretty well off of allergen immunotherapy.  We continue with Flonase  as well as Astelin , Zyrtec , and Singulair .  Respiratory editing or episodic been more dark or I daily as needed.  For her asthma, she was doing well on albuterol  as needed.  Since last visit, she has  done very well.  Asthma/Respiratory Symptom History: She has been experiencing shortness of breath despite using her current asthma medications, albuterol  and Singulair . She finds Singulair  beneficial for her allergies. She uses albuterol  regularly and has not been on Xolair  for a long time. She is going to work on getting her montelukast  for regularly.   Allergic Rhinitis Symptom History: For her allergies, she  uses Zyrtec  over the counter and occasionally uses Flonase  or Astelin  as needed. Her eyes started itching when she traveled recently, indicating ongoing allergy symptoms. She has been on shots, which were helpful. 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   Food Allergy Symptom History: She has been trying to avoid gluten in her diet, which has helped improve her stomach issues. She no longer takes omeprazole for reflux as her symptoms have improved with dietary changes.  She has a history of using Wellbutrin and confirms that she is currently on it. She also takes vitamins from her health coach. She has dyed her haor blond. She has a cute dog and has a lot of pictures to show me today. Her boyfriend works in [employer].   Otherwise, there have been no changes to her past medical history, surgical history, family history, or social history.    Review of systems otherwise negative other than that mentioned in the HPI.    Objective:   Blood pressure 108/62, pulse 89, temperature 98.6 F (37 C), temperature source Temporal, resp. rate 18, height 5' 2.21" (1.58 m), weight 175 lb 6.4 oz (79.6 kg), SpO2 96%. Body mass index is 31.87 kg/m.    Physical Exam Vitals reviewed.  Constitutional:      Appearance: She is well-developed.     Comments: Blond hair today. th  HENT:     Head: Normocephalic and atraumatic.     Right Ear: Tympanic membrane, ear canal and external ear normal.     Left Ear: Tympanic membrane, ear canal and external ear normal.     Nose: No nasal deformity, septal deviation, mucosal edema or rhinorrhea.     Right Turbinates: Enlarged and swollen.     Left Turbinates: Enlarged and swollen.     Right Sinus: No maxillary sinus tenderness or frontal sinus tenderness.     Left Sinus: No maxillary sinus tenderness or frontal sinus tenderness.     Mouth/Throat:     Mouth: Mucous membranes are not pale and not dry.     Pharynx:  Uvula midline.  Eyes:     General: Lids are normal. Allergic shiner present.        Right eye: No discharge.        Left eye: No discharge.     Conjunctiva/sclera: Conjunctivae normal.     Right eye: Right conjunctiva is not injected. No chemosis.    Left eye: Left conjunctiva is not injected. No chemosis.    Pupils: Pupils are equal, round, and reactive to light.  Cardiovascular:     Rate and Rhythm: Normal rate and regular rhythm.     Heart sounds: Normal heart sounds.  Pulmonary:     Effort: Pulmonary effort is normal. No tachypnea, accessory muscle usage or respiratory distress.     Breath sounds: Normal breath sounds. No wheezing, rhonchi or rales.  Chest:     Chest wall: No tenderness.  Lymphadenopathy:     Cervical: No cervical adenopathy.  Skin:    General: Skin is warm.     Capillary Refill:  Capillary refill takes less than 2 seconds.     Coloration: Skin is not pale.     Findings: No abrasion, erythema, petechiae or rash. Rash is not papular, urticarial or vesicular.     Comments: Some excoriations.  No urticaria.  Neurological:     Mental Status: She is alert.  Psychiatric:        Behavior: Behavior is cooperative.      Diagnostic studies:    Spirometry: Normal FEV1, FVC, and FEV1/FVC ratio. There is no scooping suggestive of obstructive disease.        Drexel Gentles, MD  [facility] 

## 2024-04-25 NOTE — Patient Instructions (Addendum)
 Idiopathic urticaria - Continue with Cetirizine  (Zyrtec ) 10mg  tablets twice daily. - Continue with Singulair  10mg  daily.    2. Allergic rhinitis/allergic conjunctivitis - doing better s/p AIT - SPT 2019: grasses, weeds, trees, molds, dust mites, cats, dogs - Use Flonase  2 sprays each nostril daily. Aim upward and outward. - Use Azelastine  1-2 sprays each nostril twice daily as needed for runny nose, drainage, sneezing, congestion. Aim upward and outward. - Use Zyrtec  10 mg daily or twice daily.   - Use Singulair  10mg  daily.    - For eyes, use Olopatadine or Ketotifen 1 eye drop daily as needed for itchy, watery eyes.  Available over the counter, if not covered by insurance.   3. Mild intermittent asthma  - Lung testing is excellent today. - Restart the SINGULAIR !  Put it in your pill box!  - Maintenance: continue Singulair  10mg  daily.  - Rescue inhaler: Albuterol  2 puffs via spacer or 1 vial via nebulizer every 4-6 hours as needed for respiratory symptoms of cough, shortness of breath, or wheezing Asthma control goals:  Full participation in all desired activities (may need albuterol  before activity) Albuterol  use two times or less a week on average (not counting use with activity) Cough interfering with sleep two times or less a month Oral steroids no more than once a year No hospitalizations  4. Recurrent infections - We will hold off on this workup for now since you are not getting sick like you used to.   5. Return in about 1 year (around 04/25/2025). You can have the follow up appointment with Dr. Idolina Maker or a Nurse Practicioner (our Nurse Practitioners are excellent and always have Physician oversight!).    Please inform us  of any Emergency Department visits, hospitalizations, or changes in symptoms. Call us  before going to the ED for breathing or allergy symptoms since we might be able to fit you in for a sick visit. Feel free to contact us  anytime with any questions, problems,  or concerns.  It was a pleasure to see you again today!  Websites that have reliable patient information: 1. American Academy of Asthma, Allergy, and Immunology: www.aaaai.org 2. Food Allergy Research and Education (FARE): foodallergy.org 3. Mothers of Asthmatics: http://www.asthmacommunitynetwork.org 4. American College of Allergy, Asthma, and Immunology: www.acaai.org      "Like" us  on Facebook and Instagram for our latest updates!      A healthy democracy works best when [employer] participate! Make sure you are registered to vote! If you have moved or changed any of your contact information, you will need to get this updated before voting! Scan the QR codes below to learn more!

## 2024-04-28 ENCOUNTER — Ambulatory Visit: Admitting: Allergy & Immunology

## 2025-04-29 ENCOUNTER — Ambulatory Visit: Admitting: Allergy & Immunology
